# Patient Record
Sex: Male | Born: 1990 | Race: White | Hispanic: No | Marital: Married | Smoking: Never smoker
Health system: Southern US, Community
[De-identification: ages and names within clinical notes are randomized; demographics above are authoritative.]

## PROBLEM LIST (undated history)

## (undated) DIAGNOSIS — L709 Acne, unspecified: Secondary | ICD-10-CM

## (undated) HISTORY — DX: Acne, unspecified: L70.9

---
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
## 2012-07-09 ENCOUNTER — Emergency Department (HOSPITAL_COMMUNITY)
Admission: EM | Admit: 2012-07-09 | Discharge: 2012-07-09 | Disposition: A | Discharge status: Home / Self Care | Payer: BC Managed Care – PPO | Attending: Emergency Medicine | Admitting: Emergency Medicine

## 2012-07-09 ENCOUNTER — Encounter (HOSPITAL_COMMUNITY): Payer: Self-pay | Admitting: *Deleted

## 2012-07-09 DIAGNOSIS — F10929 Alcohol use, unspecified with intoxication, unspecified: Secondary | ICD-10-CM

## 2012-07-09 DIAGNOSIS — R112 Nausea with vomiting, unspecified: Secondary | ICD-10-CM | POA: Insufficient documentation

## 2012-07-09 DIAGNOSIS — F1010 Alcohol abuse, uncomplicated: Secondary | ICD-10-CM | POA: Insufficient documentation

## 2012-07-09 MED ORDER — ONDANSETRON 8 MG PO TBDP
8.0000 mg | ORAL_TABLET | Freq: Once | ORAL | Status: AC
  Administered 2012-07-09: 8 mg via ORAL
  Filled 2012-07-09: qty 1

## 2012-07-09 NOTE — ED Provider Notes (Signed)
Medical screening examination/treatment/procedure(s) were performed by non-physician practitioner and as supervising physician I was immediately available for consultation/collaboration.  Jones Skene, M.D.   Jones Skene, MD 07/09/12 [phone]

## 2012-07-09 NOTE — ED Provider Notes (Signed)
History     CSN: [id number]  Arrival date & time 07/09/12  0302   First MD Initiated Contact with Patient 07/09/12 [phone]      Chief Complaint  Patient presents with  . Alcohol Intoxication   HPI  History provided by the patient and friends. Patient is a 22 year old male with no significant PMH who presents with episodes of nausea vomiting and alcohol intoxication. Patient was out with friends and began drinking around 9 PM last evening. He reports having multiple shots of "fire ball". His last drink was 30 minutes ago prior to arrival and prior to his episodes of nausea vomiting. Patient reports also having some chills and shakes but otherwise feels well. He denies any loss of consciousness. No chest pain or abdominal pain. No shortness of breath. Symptoms are described as mild. No other aggravating or alleviating factors. No other associated symptoms.    No past medical history on file.  No past surgical history on file.  No family history on file.  History  Substance Use Topics  . Smoking status: Not on file  . Smokeless tobacco: Not on file  . Alcohol Use: Not on file      Review of Systems  Constitutional: Negative for fever.  Respiratory: Negative for shortness of breath.   Cardiovascular: Negative for chest pain.  Gastrointestinal: Positive for nausea and vomiting. Negative for abdominal pain and constipation.  Neurological: Negative for syncope.  All other systems reviewed and are negative.    Allergies  Review of patient's allergies indicates not on file.  Home Medications  No current outpatient prescriptions on file.  There were no vitals taken for this visit.  Physical Exam  Nursing note and vitals reviewed. Constitutional: He is oriented to person, place, and time. He appears well-developed and well-nourished. No distress.  HENT:  Head: Normocephalic.  Breath smells of alcohol  Cardiovascular: Normal rate and regular rhythm.   No murmur  heard. Pulmonary/Chest: Effort normal and breath sounds normal. No respiratory distress. He has no wheezes. He has no rales.  Abdominal: Soft. There is no tenderness. There is no rebound and no guarding.  Musculoskeletal: Normal range of motion.  Neurological: He is alert and oriented to person, place, and time.  Skin: Skin is warm.  Psychiatric: He has a normal mood and affect.    ED Course  Procedures       1. Alcohol intoxication   2. Nausea & vomiting       MDM  3:05 AM patient seen and evaluated. Patient having some chills otherwise appears well in no acute distress. He is awake and alert x3. Admits to heavy alcohol use with multiple episodes of vomiting. No LOC. No other complaints. Patient protecting airway normally.       Angus Seller, PA-C 07/09/12 [phone]

## 2012-07-09 NOTE — ED Notes (Signed)
Patient is alert and oriented x3.  He started drinking last night at 9pm and progressively started  To vomit when friends brought him.  He denies any pain

## 2013-02-03 ENCOUNTER — Encounter (HOSPITAL_COMMUNITY): Payer: Self-pay | Admitting: Psychiatry

## 2013-02-03 ENCOUNTER — Ambulatory Visit (INDEPENDENT_AMBULATORY_CARE_PROVIDER_SITE_OTHER): Payer: BC Managed Care – PPO | Admitting: Psychiatry

## 2013-02-03 ENCOUNTER — Encounter (INDEPENDENT_AMBULATORY_CARE_PROVIDER_SITE_OTHER): Payer: Self-pay

## 2013-02-03 VITALS — BP 133/72 | HR 98 | Wt 124.0 lb

## 2013-02-03 DIAGNOSIS — F411 Generalized anxiety disorder: Secondary | ICD-10-CM

## 2013-02-03 DIAGNOSIS — Z79899 Other long term (current) drug therapy: Secondary | ICD-10-CM

## 2013-02-03 DIAGNOSIS — F419 Anxiety disorder, unspecified: Secondary | ICD-10-CM

## 2013-02-03 MED ORDER — BUPROPION HCL ER (XL) 150 MG PO TB24: 150.0000 mg | ORAL_TABLET | ORAL | Status: DC

## 2013-02-03 NOTE — Progress Notes (Addendum)
[facility] Behavioral Health Initial Assessment Note  Don Sullivan [id number] 22 y.o.  02/03/2013 10:49 AM  Chief Complaint:  I have a lot of anxiety and nervousness.  I also has difficulty doing multitasking.  History of Present Illness:  The patient is 22 year old Caucasian employed man who is self-referred for seeking treatment for his anxiety symptoms.  Patient also endorsed difficulty doing multitasking.  Patient endorses that he saw psychiatrist at [facility] a few months ago and given Paxil.  Initially he was taking 10 mg twice a day and then dose was increased but patient decided experiencing increased side effects including anger, agitation, and hallucination and more aggression.  He also endorsed increased drinking with the Paxil.  He took for 2 months and then he stopped.  Since he stopped Paxil he has no aggression or violence and he stopped drinking.  Patient reported symptoms of lack of motivation, poor attention poor concentration and falling sleep during the job.  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  He is very behind and gets easily distracted.  At work if he is not motivated to do work, he is using Psychologist, sport and exercise her going online shopping.  Patient also endorsed limited socialization and very anxious around people.  Patient experiencing increased heart rate, sweating, vigilant and anxious in crowds.  Patient admitted these symptoms started when he was in middle school and he has trouble focusing in class.  Patient do very well in computer classes are when he is by himself.  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  Patient also reported poor self image about his body shape and acne.  He has limited social network.  Patient denies any aggression, violence, paranoia, hallucination, psychosis, poor  impulse control, suicidal thoughts, homicidal thoughts, OCD symptoms or any PTSD symptoms.  He had some anxiety and occasionally panic attack when he is around people.  However he denies any agoraphobia.  He is very concerned about his attention focus and multitasking.  He is also concerned about his social anxiety.  He is open to try any new medication.  Suicidal Ideation: No Plan Formed: No Patient has means to carry out plan: No  Homicidal Ideation: No Plan Formed: No Patient has means to carry out plan: No  Past Psychiatric History/Hospitalization(s) Patient endorses history of attention and focus since middle school.  Around same time he has a lot of anxiety and nervousness.  He always have a limited social network.  He denies any history of mania, psychosis, hallucinations, suicidal thoughts, homicidal thoughts or any poor impulse control.  He was seen by a psychiatrist at [facility] and given Paxil, initially he was given low dose however when the dose was increased he started to experience increased agitation anger and increase in alcohol use.  Patient is not happy at [facility].  Patient denies any treatment with ECT or any other psychotropic medication. Anxiety: Yes Bipolar Disorder: No Depression: No Mania: No Psychosis: No Schizophrenia: No Personality Disorder: No Hospitalization for psychiatric illness: No History of Electroconvulsive Shock Therapy: No Prior Suicide Attempts: No  Medical History; Patient has acne.  His primary care physician is Dr. Kirby Funk at [facility].  His dermatologist is Dr. Debby Freiberg .  He is taking Septra for his acne.  Patient denies any history of headaches, seizures,  traumatic brain injury or any loss of consciousness.  Traumatic brain injury:  denies  Family History;  denies  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  Psychosocial History; Patient was born and raised in  [place].  Her parents are divorced.  He is only child from his parents.  His father lives 20 minutes away and his mother lives only 2 minutes away.  Patient is living with his fiancee for he is engaged for past 2 years.  They have 46-year-old child and currently her fianc is expecting.  The patient has a good relationship with her fianc.  Legal History;  denies  History Of Abuse; Patient denies any history of physical, sexual, verbal notably emotional abuse.    Substance Abuse History;  Patient admitted history of drinking heavily when he was taking Paxil.  He admitted binge drinking but denies any tremors, shakes or any withdrawal symptoms.  Since he stopped Paxil he is no longer drinking alcohol. Patient admitted smoking marijuana and Klonopin 4 years ago however he had a bad reaction and since then he has never used marijuana or any benzodiazepine.    Review of Systems: Psychiatric: Agitation: No Hallucination: No Depressed Mood: No Insomnia: No Hypersomnia: No Altered Concentration: No Feels Worthless: No Grandiose Ideas: No Belief In Special Powers: No New/Increased Substance Abuse: No Compulsions: No  Neurologic: Headache: No Seizure: No Paresthesias: No    Outpatient Encounter Prescriptions as of 02/03/2013  Medication Sig Dispense Refill  . sulfamethoxazole-trimethoprim (BACTRIM DS) 800-160 MG per tablet       . buPROPion (WELLBUTRIN XL) 150 MG 24 hr tablet Take 1 tablet (150 mg total) by mouth every morning.  30 tablet  0  . [DISCONTINUED] cimetidine (TAGAMET) 200 MG tablet Take 400 mg by mouth 2 (two) times daily.       . [DISCONTINUED] OVER THE COUNTER MEDICATION Take 1 capsule by mouth 2 (two) times daily. OTC.Acnepril       No facility-administered encounter medications on file as of 02/03/2013.    No results found for this or any previous visit (from the past 2160 hour(s)).    Physical Exam: Constitutional:  BP 133/72  Pulse 98  Wt 124 lb (56.246  kg)  Musculoskeletal: Strength & Muscle Tone: within normal limits Gait & Station: normal Patient leans: N/A  Mental Status Examination;  Patient is a young man who is casually dressed and fairly groomed.  He appears anxious and shy.  He maintains fair eye contact.  His speech is slow, fluent and coherent.  His thought processes logical and goal-directed.  He described his mood is anxious and his affect is mood appropriate.  He denies any active or passive suicidal thoughts or homicidal thoughts.  He denies any auditory or visual hallucination.  There were no paranoia or delusions present at this time.  There were no tremors or shakes.  Psychomotor activity is slightly decreased.  His fund of knowledge is adequate.  His attention and concentration is fair.  He is alert and oriented x3.  His insight judgment and impulse control is okay.   Medical Decision Making (Choose Three): Established Problem, Stable/Improving (1), Review of Psycho-Social Stressors (1), Review or order clinical lab tests (1), Decision to obtain old records (1), Established Problem, Worsening (2), New Problem, with no additional work-up planned (3), Review of Medication Regimen & Side Effects (2) and Review of New Medication or Change in Dosage (2)  Assessment: Axis  I:  social anxiety disorder, rule out ADHD  Axis II:  deferred  Axis III:  Past Medical History  Diagnosis Date  . Acne     Axis IV:  mild to moderate   Plan:   I reviewed his history, psychosocial stressors, current medication in response to his medication.  Patient has symptoms of anxiety, nervousness and difficulty in focusing and being multitasking.  The patient has done a lot of research on the Internet about psychotropic medication.  We talked about side effects in detail.  I recommend to try Wellbutrin XL 150 mg that can help his attention, focus and also help his anxiety symptoms.  Patient is aware that any stimulant may worsen his anxiety symptoms.   I would also do CBC, CMP, hemoglobin A1c, lipid panel and TSH since patient has not done blood work in a while.  I recommend to see therapist for coping and social skills.  I recommend to call us back if he has any question concerns or having worsening of the symptoms.  I will see him again in 3 weeks.Time spent 55 minutes.  More than 50% of the time spent in psychoeducation, counseling and coordination of care.  Discuss safety plan that anytime having active suicidal thoughts or homicidal thoughts then patient need to call 911 or go to the local emergency room.    Daissy Yerian T., MD 02/03/2013

## 2013-02-16 ENCOUNTER — Other Ambulatory Visit: Payer: Self-pay

## 2013-02-17 LAB — HEMOGLOBIN A1C
Hgb A1c MFr Bld: 5.1 % (ref <5.7)
Mean Plasma Glucose: 100 mg/dL (ref <117)

## 2013-02-17 LAB — LIPID PANEL
Cholesterol: 158 mg/dL (ref 0–200)
HDL: 37 mg/dL — ABNORMAL LOW (ref >39)
LDL Cholesterol: 88 mg/dL (ref 0–99)
Total CHOL/HDL Ratio: 4.3 Ratio
Triglycerides: 166 mg/dL — ABNORMAL HIGH (ref <150)
VLDL: 33 mg/dL (ref 0–40)

## 2013-02-17 LAB — CBC WITH DIFFERENTIAL/PLATELET
Basophils Relative: 1 % (ref 0–1)
Eosinophils Absolute: 0.1 10*3/uL (ref 0.0–0.7)
HCT: 41.6 % (ref 39.0–52.0)
Hemoglobin: 14.8 g/dL (ref 13.0–17.0)
MCH: 31.4 pg (ref 26.0–34.0)
MCHC: 35.6 g/dL (ref 30.0–36.0)
Monocytes Absolute: 0.5 10*3/uL (ref 0.1–1.0)
Monocytes Relative: 13 % — ABNORMAL HIGH (ref 3–12)
Neutrophils Relative %: 43 % (ref 43–77)
Platelets: 236 10*3/uL (ref 150–400)

## 2013-02-18 LAB — TSH: TSH: 2.435 u[IU]/mL (ref 0.350–4.500)

## 2013-02-24 ENCOUNTER — Ambulatory Visit (INDEPENDENT_AMBULATORY_CARE_PROVIDER_SITE_OTHER): Payer: BC Managed Care – PPO | Admitting: Psychiatry

## 2013-02-24 ENCOUNTER — Encounter (HOSPITAL_COMMUNITY): Payer: Self-pay | Admitting: Psychiatry

## 2013-02-24 VITALS — BP 129/70 | HR 96 | Wt 123.0 lb

## 2013-02-24 DIAGNOSIS — F411 Generalized anxiety disorder: Secondary | ICD-10-CM

## 2013-02-24 DIAGNOSIS — F419 Anxiety disorder, unspecified: Secondary | ICD-10-CM

## 2013-02-24 MED ORDER — BUPROPION HCL ER (XL) 300 MG PO TB24: 300.0000 mg | ORAL_TABLET | ORAL | Status: DC

## 2013-02-24 NOTE — Progress Notes (Signed)
[facility] Behavioral Health [id number] Progress Note  Don Sullivan [id number] 22 y.o.  02/24/2013 8:51 AM  Chief Complaint:  I felt better in the first week but then started to feel the same .    History of Present Illness:  Don Sullivan came for his followup appointment.  He was seen first time on October 24 phonation evaluation.  He was started on Wellbutrin XL 150 mg daily.  Patient felt better in first week, he was able to do multiple things and feeling less depressed and less anxious however after week later his energy level go down.  He remains anxious and nervous.  He denies any side effects other than headaches which are less intense now.  He denies any tremors or shakes.  He has noticed not taking naps during the day .  He is able to do multitasking at work.  He is able to concentrate on the bigger projects.  He is not drinking or using and a little substance.  He denies any aggression or any violence which he has with Paxil.  He is living with his fiance who is expecting .  He wants to do home renovation projects and coming months.  Patient is wondering about medication dose can be further increase.  He denies any crying spells, panic attack but remains very anxious in public places.  He denies any paranoia or any hallucination.  He denies any suicidal thoughts or homicidal thoughts.  He had a blood work which shows normal results except for low HDL.  Suicidal Ideation: No Plan Formed: No Patient has means to carry out plan: No  Homicidal Ideation: No Plan Formed: No Patient has means to carry out plan: No  Past Psychiatric History/Hospitalization(s) Patient endorses history of poor attention, focus anxiety and nervousness since middle school.  He always have a limited social network.  He denies any history of mania, psychosis, hallucinations, suicidal thoughts, homicidal thoughts or any poor impulse control.  He was seen by a psychiatrist at [facility] and given Paxil, initially he was given low dose  however when the dose was increased he started to experience increased agitation anger and increase in alcohol use.  Patient is not happy at [facility].  Patient denies any treatment with ECT or any other psychotropic medication. Anxiety: Yes Bipolar Disorder: No Depression: No Mania: No Psychosis: No Schizophrenia: No Personality Disorder: No Hospitalization for psychiatric illness: No History of Electroconvulsive Shock Therapy: No Prior Suicide Attempts: No  Medical History; Patient has acne.  His primary care physician is Dr. Kirby Funk at [facility].  His dermatologist is Dr. Debby Freiberg .  He is taking Septra for his acne.  Patient denies any history of headaches, seizures, traumatic brain injury or any loss of consciousness.  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  Psychosocial History; Patient born and raised in [place].  Her parents are divorced.  He is only child from his parents.  His father lives 20 minutes away and his mother lives only 2 minutes away.  Patient is living with his fiancee for he is engaged for past 2 years.  They have 35-year-old child and currently her fianc is expecting.  He has good relationship with her fianc.  Review of Systems: Psychiatric: Agitation: No Hallucination: No Depressed Mood: No Insomnia: No Hypersomnia: No Altered Concentration: No Feels Worthless: No Grandiose Ideas: No Belief In Special Powers: No New/Increased Substance Abuse: No Compulsions: No  Neurologic: Headache: No  Seizure: No Paresthesias: No    Outpatient Encounter Prescriptions as of 02/24/2013  Medication Sig  . buPROPion (WELLBUTRIN XL) 300 MG 24 hr tablet Take 1 tablet (300 mg total) by mouth every morning.  . sulfamethoxazole-trimethoprim (BACTRIM DS) 800-160 MG per tablet   . [DISCONTINUED] buPROPion (WELLBUTRIN XL) 150 MG 24 hr tablet Take 1 tablet (150 mg total) by mouth every  morning.    Recent Results (from the past 2160 hour(s))  HEMOGLOBIN A1C     Status: None   Collection Time    02/17/13  3:57 PM      Result Value Range   Hemoglobin A1C 5.1  <5.7 %   Comment:                                                                            According to the ADA Clinical Practice Recommendations for 2011, when     HbA1c is used as a screening test:             >=6.5%   Diagnostic of Diabetes Mellitus                (if abnormal result is confirmed)           5.7-6.4%   Increased risk of developing Diabetes Mellitus           References:Diagnosis and Classification of Diabetes Mellitus,Diabetes     Care,2011,34(Suppl 1):S62-S69 and Standards of Medical Care in             Diabetes - 2011,Diabetes Care,2011,34 (Suppl 1):S11-S61.         Mean Plasma Glucose 100  <117 mg/dL  TSH     Status: None   Collection Time    02/17/13  3:57 PM      Result Value Range   TSH 2.435  0.350 - 4.500 uIU/mL  LIPID PANEL     Status: Abnormal   Collection Time    02/17/13  3:57 PM      Result Value Range   Cholesterol 158  0 - 200 mg/dL   Comment: ATP III Classification:           < 200        mg/dL        Desirable          200 - 239     mg/dL        Borderline High          >= 240        mg/dL        High         Triglycerides 166 (*) <150 mg/dL   HDL 37 (*) >78 mg/dL   Total CHOL/HDL Ratio 4.3     VLDL 33  0 - 40 mg/dL   LDL Cholesterol 88  0 - 99 mg/dL   Comment:       Total Cholesterol/HDL Ratio:CHD Risk                            Coronary Heart Disease Risk Table  Men       Women              1/2 Average Risk              3.4        3.3                  Average Risk              5.0        4.4               2X Average Risk              9.6        7.1               3X Average Risk             23.4       11.0     Use the calculated Patient Ratio above and the CHD Risk table      to determine the patient's CHD Risk.      ATP III Classification (LDL):           < 100        mg/dL         Optimal          100 - 129     mg/dL         Near or Above Optimal          130 - 159     mg/dL         Borderline High          160 - 189     mg/dL         High           > 190        mg/dL         Very High        CBC WITH DIFFERENTIAL     Status: Abnormal   Collection Time    02/17/13  3:57 PM      Result Value Range   WBC 3.9 (*) 4.0 - 10.5 K/uL   RBC 4.72  4.22 - 5.81 MIL/uL   Hemoglobin 14.8  13.0 - 17.0 g/dL   HCT 30.8  65.7 - 84.6 %   MCV 88.1  78.0 - 100.0 fL   MCH 31.4  26.0 - 34.0 pg   MCHC 35.6  30.0 - 36.0 g/dL   RDW 96.2  95.2 - 84.1 %   Platelets 236  150 - 400 K/uL   Neutrophils Relative % 43  43 - 77 %   Neutro Abs 1.7  1.7 - 7.7 K/uL   Lymphocytes Relative 41  12 - 46 %   Lymphs Abs 1.6  0.7 - 4.0 K/uL   Monocytes Relative 13 (*) 3 - 12 %   Monocytes Absolute 0.5  0.1 - 1.0 K/uL   Eosinophils Relative 2  0 - 5 %   Eosinophils Absolute 0.1  0.0 - 0.7 K/uL   Basophils Relative 1  0 - 1 %   Basophils Absolute 0.0  0.0 - 0.1 K/uL   Smear Review Criteria for review not met        Physical Exam: Constitutional:  BP 129/70  Pulse 96  Wt 123 lb (55.792 kg)  Musculoskeletal: Strength & Muscle Tone: within normal limits Gait & Station: normal  Patient leans: N/A  Mental Status Examination;  Patient is a young man who is casually dressed and fairly groomed.  He appears anxious but cooperative.  He maintains fair eye contact.  His speech is slow, fluent and coherent.  His thought processes logical and goal-directed.  He described his mood is better and his affect is improved.  He denies any active or passive suicidal thoughts or homicidal thoughts.  He denies any auditory or visual hallucination.  There were no paranoia or delusions present at this time.  There were no tremors or shakes.  Psychomotor activity is slightly decreased.  His fund of knowledge is adequate.  His attention and  concentration is fair.  He is alert and oriented x3.  His insight judgment and impulse control is okay.   Medical Decision Making (Choose Three): Established Problem, Stable/Improving (1), Review of Psycho-Social Stressors (1), Review or order clinical lab tests (1), Review of Last Therapy Session (1), Review of Medication Regimen & Side Effects (2) and Review of New Medication or Change in Dosage (2)  Assessment: Axis I:  social anxiety disorder, rule out ADHD  Axis II:  deferred  Axis III:  Past Medical History  Diagnosis Date  . Acne     Axis IV:  mild to moderate   Plan:  I review and discuss his blood work results.  Patient is tolerating Wellbutrin 150 mg and denies any side effects.  He has seen some improvement, I will increase to 300 mg.  Discuss in detail the risks and benefits of medication.  Reassurance given.  Followup in 6 weeks.  Time spent 55 minutes.  More than 50% of the time spent in psychoeducation, counseling and coordination of care.  Discuss safety plan that anytime having active suicidal thoughts or homicidal thoughts then patient need to call 911 or go to the local emergency room.    Lilliane Sposito T., MD 02/24/2013

## 2013-03-02 ENCOUNTER — Ambulatory Visit (HOSPITAL_COMMUNITY): Payer: Self-pay | Admitting: Psychiatry

## 2013-03-17 ENCOUNTER — Encounter (HOSPITAL_COMMUNITY): Payer: Self-pay | Admitting: Psychology

## 2013-03-17 ENCOUNTER — Ambulatory Visit (INDEPENDENT_AMBULATORY_CARE_PROVIDER_SITE_OTHER): Payer: BC Managed Care – PPO | Admitting: Psychology

## 2013-03-17 DIAGNOSIS — F4010 Social phobia, unspecified: Secondary | ICD-10-CM

## 2013-03-17 NOTE — Progress Notes (Signed)
Patient:   Don Sullivan   DOB:   1990-12-12  MR Number:  [id number]  Location:  [facility] OUTPATIENT THERAPY [place] [address] [id number] [place] [address] Dept: [phone]           Date of Service:   03/17/13  Start Time:   9.10am- End Time:   10:10am  Provider/Observer:  [facility]       Billing Code/Service: [phone]  Chief Complaint:     Chief Complaint  Patient presents with  . Anxiety  . Stress    relationship    Reason for Service:  Pt is referred for counseling to assist coping w/ Social Anxiety D/O and stressors from relationship.  Pt reported he has been "shy" all his life and as began researching realized that he experiences a lot of symptoms of social anxiety.  Pt reported that when in public he becomes overly self aware and self conscious- trying to figure out that right way to be and feeling inadequate.  Pt reports when having to give presentations- interviews- severe anxiety w/ shaking.  Pt reports struggles to keep conversations going in social interactions.  Pt reported that he doesn't have any friends besides fiancee's friends and family.  Pt also reports noticing some symptoms of ADHD- inattentive with difficutly follw through and motivation w/ activities that require a lot concentration or long periods of focus.  Pt also reported stress of relationship w/ fiancee as pressuring for marriage and pt doesn't feel ready to make marriage commitment as recognizes a lot of distrust from fiancee that he feels unfounded.    Current Status:  Pt reported that symptoms have improved w/ medication management.  Pt may avoid things that cause anxiety, but not avoidance if would impact job or schooling.    Reliability of Information: Pt provided information.  Behavioral Observation: Don Sullivan  presents as a 22 y.o.-year-old  Caucasian Male who appeared his stated age. his dress was Appropriate and he was Well  Groomed and his manners were Appropriate to the situation.  There were not any physical disabilities noted.  he displayed an appropriate level of cooperation and motivation.    Interactions:    Active   Attention:   within normal limits  Memory:   within normal limits  Visuo-spatial:   not examined  Speech (Volume):  normal  Speech:   normal pitch and normal volume  Thought Process:  Coherent and Relevant  Though Content:  WNL  Orientation:   person, place, time/date and situation  Judgment:   Good  Planning:   Good  Affect:    Anxious and Appropriate  Mood:    Anxious  Insight:   Good  Intelligence:   normal  Marital Status/Living: Pt lives w/ his Don Sullivan, [place], their 2y/o daughter, Don Sullivan, and they are expecting their 2nd child due April 2014.  Pt reports that he and fiancee have been together since Sophomore year of HS and have been living together since [employer] of [address].  They are currently living in home of his father's - father has moved out.  Father is still supporting some financially- but pt plans on taking over the mortgage in next couple of years.  Pt reports gets along well w/ fiancee family.  Pt had older half brother who died 2 years ago- causes unknown to pt.  Pt reports he was significant older and didn't grew up in household together.  Pt reports mom is in her mid  50s and suffering from early onset dementia. He reports that she likely has a problem w/ drinking.    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  Past Employment:  n/a  Substance Use:  No concerns of substance abuse are reported.  Pt reported he stopped drinking alcohol over a month ago as noticed when on paxil and drank- was becoming agitated.   Education:   College  Medical History:   Past Medical History  Diagnosis Date  . Acne         Outpatient Encounter Prescriptions as of 03/17/2013  Medication Sig  . buPROPion (WELLBUTRIN XL) 300 MG 24 hr tablet Take  1 tablet (300 mg total) by mouth every morning.  . sulfamethoxazole-trimethoprim (BACTRIM DS) 800-160 MG per tablet           Sexual History:   History  Sexual Activity  . Sexual Activity: Yes    Abuse/Trauma History: None reported  Psychiatric History:  No counseling previous.  Pt began tx w/ Monrach- but dissatisfied.  Started medication management w/ Dr. Lolly Mustache about 1 month ago.   Family Med/Psych History: History reviewed. No pertinent family history.  Risk of Suicide/Violence: virtually non-existent no hx of SI/HI or self harm or aggression.  Impression/DX:  Pt is a 22y/o male who presents seeking counseling to assist with social anxiety and stressors.  Pt endorses hx of social anxiety beginning in middle school but just recently seeking tx.  Pt discusses stressor of relationship with fiance and pressure to marry.  Pt good insight, motivated in tx and articulate. No reports of depressive episodes, SA or SI.   Disposition/Plan:  F/u in 1-2 weeks.  See tx plan.  Diagnosis:     Social anxiety disorder

## 2013-03-31 ENCOUNTER — Ambulatory Visit (INDEPENDENT_AMBULATORY_CARE_PROVIDER_SITE_OTHER): Payer: BC Managed Care – PPO | Admitting: Psychology

## 2013-03-31 DIAGNOSIS — F4010 Social phobia, unspecified: Secondary | ICD-10-CM

## 2013-03-31 NOTE — Progress Notes (Signed)
   THERAPIST PROGRESS NOTE  Session Time: 8.08am-9:15am  Participation Level: Active  Behavioral Response: Well GroomedAlertAnxious  Type of Therapy: Individual Therapy  Treatment Goals addressed: Diagnosis: Social Anxiety D/O and goal 1.  Interventions: CBT and Other: [employer]- heart focus and heart breathing techniqus  Summary: Don Sullivan is a 22 y.o. male who presents with slight anxious affect. Pt discloses well in session.  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  Pt reports at work seems to have maintained focus, but not at home. Pt reports stressor of relationship w/ fiancee.  Pt discloses that he has come to realize not wanting to continue in his relationship w/ his fiancee, but feels trapped as cares about her, doesn't want to hurt her, doesn't want to leave kids.  Pt was able to practice heart math techniques in session, expressed feeling more calm and "alive" with practicing.  Pt agrees to implement at home.  Pt reports he is will also give thought to couples counseling.   Suicidal/Homicidal: Nowithout intent/plan  Therapist Response: Assessed pt current functioning per pt report.  Processed w/ pt stressors and how impacting emotion.  Introduced to heart math techniques and led pt in session w/ practice, processed response and discussed how to implement at home.   Plan: Return again in 3-4 weeks per pt request due to financial reasons. .  Diagnosis: Axis I: Social Anxiety    Axis II: No diagnosis    [place]iana Searles, LPC 03/31/2013

## 2013-04-14 ENCOUNTER — Ambulatory Visit (HOSPITAL_COMMUNITY): Payer: Self-pay | Admitting: Psychiatry

## 2013-04-21 ENCOUNTER — Encounter (HOSPITAL_COMMUNITY): Payer: Self-pay | Admitting: Psychiatry

## 2013-04-21 ENCOUNTER — Ambulatory Visit (INDEPENDENT_AMBULATORY_CARE_PROVIDER_SITE_OTHER): Payer: Private Health Insurance - Indemnity | Admitting: Psychiatry

## 2013-04-21 VITALS — BP 116/78 | HR 108 | Wt 119.0 lb

## 2013-04-21 DIAGNOSIS — F419 Anxiety disorder, unspecified: Secondary | ICD-10-CM

## 2013-04-21 DIAGNOSIS — F411 Generalized anxiety disorder: Secondary | ICD-10-CM

## 2013-04-21 MED ORDER — BUPROPION HCL ER (XL) 450 MG PO TB24: 450.0000 mg | ORAL_TABLET | ORAL | Status: DC

## 2013-04-21 NOTE — Progress Notes (Signed)
[facility] Progress Note  Don Sullivan [id number] 23 y.o.  04/21/2013 8:54 AM  Chief Complaint:  I still have a lot of anxiety and distress.    History of Present Illness:  Don Sullivan came for his followup appointment.  On his last visit we increased his Wellbutrin to 300 mg.  He is taking his medication and seeing improvement in his focus and attention but he also feels that his anxiety is not getting better.  He continues to have nervousness especially when he goes in public.  His fiance is expecting in April and patient is very happy.  Patient decided to get married in September 2015.  He was very busy in the Christmas.  He visited multiple family members however he was very anxious around strangers.  He also noticed increased stress at work which he believes because he is able to do more for him feeling more sense of responsibility .  Patient does not have any side effects of medication however admitted decreased appetite and he has lost weight from the last visit.  Patient told this is his baseline but admitted some time he skipped a meal because he has no appetite.  He has no tremors or shakes.  He is not drinking or using any illegal substances.  He is not taking naps during the day however he still feels sometimes very tired and lack of motivation to do things.  He has not started the renovation projects which he wants to do and coming months.  He is wondering if he has ADD and he likes to try stimulants.  He denies any crying spells, panic attack, aggression, violence or any feeling of hopelessness or helplessness.  He is seeing therapist this office her coping skills.  Suicidal Ideation: No Plan Formed: No Patient has means to carry out plan: No  Homicidal Ideation: No Plan Formed: No Patient has means to carry out plan: No  Past Psychiatric History/Hospitalization(s) Patient endorses history of poor attention, focus anxiety and nervousness since middle school.  He always  had a limited social network.  He denies any history of mania, psychosis, hallucinations, suicidal thoughts, homicidal thoughts or any poor impulse control.  He was seen by a psychiatrist at [facility] and given Paxil, initially he was given low dose however when the dose was increased he started to experience increased agitation anger and increase in alcohol use.  Patient endorses history of social anxiety and nervousness. Anxiety: Yes Bipolar Disorder: No Depression: No Mania: No Psychosis: No Schizophrenia: No Personality Disorder: No Hospitalization for psychiatric illness: No History of Electroconvulsive Shock Therapy: No Prior Suicide Attempts: No  Medical History; Patient has acne.  His primary care physician is Dr. Lavone Orn at [facility].  His dermatologist is Dr. Roxy Cedar .  He is taking Septra for his acne.  Patient denies any history of headaches, seizures, traumatic brain injury or any loss of consciousness.  Education and Work History; Patient has Public relations account executive.  He is working at [employer] for past 1-1/2 years.  Psychosocial History; Patient born and raised in [place].  Her parents are divorced.  He is only child from his parents.  His father lives 20 minutes away and his mother lives only 2 minutes away.  Patient is living with his fiancee for he is engaged for past 2 years.  They have 66-year-old child and currently her fianc is expecting.  He has good relationship with her fianc.  Review of Systems: Psychiatric: Agitation: No Hallucination: No Depressed  Mood: No Insomnia: No Hypersomnia: No Altered Concentration: No Feels Worthless: No Grandiose Ideas: No Belief In Special Powers: No New/Increased Substance Abuse: No Compulsions: No  Neurologic: Headache: No Seizure: No Paresthesias: No    Outpatient Encounter Prescriptions as of 04/21/2013  Medication Sig  . buPROPion 450 MG TB24 Take 450 mg by mouth every morning.  .  sulfamethoxazole-trimethoprim (BACTRIM DS) 800-160 MG per tablet   . [DISCONTINUED] buPROPion (WELLBUTRIN XL) 300 MG 24 hr tablet Take 1 tablet (300 mg total) by mouth every morning.    Recent Results (from the past 2160 hour(s))  HEMOGLOBIN A1C     Status: None   Collection Time    02/17/13  3:57 PM      Result Value Range   Hemoglobin A1C 5.1  <5.7 %   Comment:                                                                            According to the ADA Clinical Practice Recommendations for 2011, when     HbA1c is used as a screening test:             >=6.5%   Diagnostic of Diabetes Mellitus                (if abnormal result is confirmed)           5.7-6.4%   Increased risk of developing Diabetes Mellitus           References:Diagnosis and Classification of Diabetes Mellitus,Diabetes     VELF,8101,75(ZWCHE 1):S62-S69 and Standards of Medical Care in             Diabetes - 2011,Diabetes Care,2011,34 (Suppl 1):S11-S61.         Mean Plasma Glucose 100  <117 mg/dL  TSH     Status: None   Collection Time    02/17/13  3:57 PM      Result Value Range   TSH 2.435  0.350 - 4.500 uIU/mL  LIPID PANEL     Status: Abnormal   Collection Time    02/17/13  3:57 PM      Result Value Range   Cholesterol 158  0 - 200 mg/dL   Comment: ATP III Classification:           < 200        mg/dL        Desirable          200 - 239     mg/dL        Borderline High          >= 240        mg/dL        High         Triglycerides 166 (*) <150 mg/dL   HDL 37 (*) >39 mg/dL   Total CHOL/HDL Ratio 4.3     VLDL 33  0 - 40 mg/dL   LDL Cholesterol 88  0 - 99 mg/dL   Comment:       Total Cholesterol/HDL Ratio:CHD Risk  Coronary Heart Disease Risk Table                                            Men       Women              1/2 Average Risk              3.4        3.3                  Average Risk              5.0        4.4               2X Average Risk              9.6         7.1               3X Average Risk             23.4       11.0     Use the calculated Patient Ratio above and the CHD Risk table      to determine the patient's CHD Risk.     ATP III Classification (LDL):           < 100        mg/dL         Optimal          100 - 129     mg/dL         Near or Above Optimal          130 - 159     mg/dL         Borderline High          160 - 189     mg/dL         High           > 190        mg/dL         Very High        CBC WITH DIFFERENTIAL     Status: Abnormal   Collection Time    02/17/13  3:57 PM      Result Value Range   WBC 3.9 (*) 4.0 - 10.5 K/uL   RBC 4.72  4.22 - 5.81 MIL/uL   Hemoglobin 14.8  13.0 - 17.0 g/dL   HCT 41.6  39.0 - 52.0 %   MCV 88.1  78.0 - 100.0 fL   MCH 31.4  26.0 - 34.0 pg   MCHC 35.6  30.0 - 36.0 g/dL   RDW 13.1  11.5 - 15.5 %   Platelets 236  150 - 400 K/uL   Neutrophils Relative % 43  43 - 77 %   Neutro Abs 1.7  1.7 - 7.7 K/uL   Lymphocytes Relative 41  12 - 46 %   Lymphs Abs 1.6  0.7 - 4.0 K/uL   Monocytes Relative 13 (*) 3 - 12 %   Monocytes Absolute 0.5  0.1 - 1.0 K/uL   Eosinophils Relative 2  0 - 5 %   Eosinophils Absolute 0.1  0.0 - 0.7 K/uL   Basophils Relative 1  0 - 1 %   Basophils Absolute 0.0  0.0 - 0.1 K/uL   Smear Review Criteria for review not met        Physical Exam: Constitutional:  BP 116/78  Pulse 108  Wt 119 lb (53.978 kg)  Musculoskeletal: Strength & Muscle Tone: within normal limits Gait & Station: normal Patient leans: N/A  Mental Status Examination;  Patient is a young man who is casually dressed and fairly groomed.  He appears anxious but cooperative.  He maintains fair eye contact.  His speech is slow, fluent and coherent.  His thought processes logical and goal-directed.  He described his mood is neutral and his affect is mood appropriate.  He denies any active or passive suicidal thoughts or homicidal thoughts.  He denies any auditory or visual hallucination.  There were no  paranoia or delusions present at this time.  There were no tremors or shakes.  Psychomotor activity is slightly decreased.  His fund of knowledge is adequate.  His attention and concentration is fair.  He is alert and oriented x3.  His insight judgment and impulse control is okay.   Medical Decision Making (Choose Three): Established Problem, Stable/Improving (1), Review of Psycho-Social Stressors (1), Review of Last Therapy Session (1), Review of Medication Regimen & Side Effects (2) and Review of New Medication or Change in Dosage (2)  Assessment: Axis I:  social anxiety disorder, rule out ADHD  Axis II:  deferred  Axis III:  Past Medical History  Diagnosis Date  . Acne     Axis IV:  mild to moderate   Plan:  I recommend to increase Wellbutrin to 450 mg .  I also discussed about his appetite and weight loss.  Encouraged to take meals on time.  Patient also wondering if he can take stimulant however I explained that taking a stimulant may cause worsening of anxiety and nervousness.  We will defer adding stimulant at this time however if his attention and focus does not improve to consider low-dose stimulant in the future.  Patient does not have any tremors or shakes.  Recommend to see therapist for coping and social skills.  Followup in 2 months.  Time spent 25 minutes.  More than 50% of the time spent in psychoeducation, counseling and coordination of care.  Discuss safety plan that anytime having active suicidal thoughts or homicidal thoughts then patient need to call 911 or go to the local emergency room.    Prisma Decarlo T., MD 04/21/2013

## 2013-05-01 ENCOUNTER — Telehealth (HOSPITAL_COMMUNITY): Payer: Self-pay | Admitting: Psychiatry

## 2013-05-01 ENCOUNTER — Telehealth (HOSPITAL_COMMUNITY): Payer: Self-pay | Admitting: *Deleted

## 2013-05-01 MED ORDER — BUPROPION HCL ER (XL) 150 MG PO TB24: ORAL_TABLET | ORAL | Status: DC

## 2013-05-01 NOTE — Telephone Encounter (Signed)
Ordered changed to Wellbutrin XL 150mg  3 tablets every morning per Dr. Lolly MustacheArfeen. Pt unable to afford brand name and requested Generic form however 450mg  does not come in generic form. Called to notify patient of prescription change no answer at this time. Will try again later today.

## 2013-05-01 NOTE — Telephone Encounter (Signed)
Pt called stating the dosage was changed on his medication and this also changed the brand at his pharmacy. The new dosage change has caused and increase in the cost and he is unable to afford it currently. He would like to know what to do.

## 2013-05-01 NOTE — Telephone Encounter (Signed)
Will change into generic Wellbutrin.

## 2013-05-05 ENCOUNTER — Ambulatory Visit (INDEPENDENT_AMBULATORY_CARE_PROVIDER_SITE_OTHER): Payer: Private Health Insurance - Indemnity | Admitting: Psychology

## 2013-05-05 DIAGNOSIS — F4010 Social phobia, unspecified: Secondary | ICD-10-CM

## 2013-05-05 NOTE — Progress Notes (Signed)
   THERAPIST PROGRESS NOTE  Session Time: 10:11am-11am  Participation Level: Active  Behavioral Response: Well GroomedAlertEuthymic  Type of Therapy: Individual Therapy  Treatment Goals addressed: Diagnosis: Social Anxiety and goal 1.  Interventions: CBT and Supportive  Summary: Don MulletDylan Sullivan is a 23 y.o. male who presents with full and bright affect.   Pt reported on a lot of recent happenings in his life.  He and his fiancee have set a date for the wedding- 12/30/13, pt has a raise from work, pt got a loan for wedding and paying down debt and so not feeling stressors of fiances.  Pt expressed feeling in better place w/ relationship and not letting anxiety govern decision.  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  Pt did report that he still avoids doing larger jobs- will procrastinate.  Pt does feel improvement w/ anxiety, improvement w/ energy, but still feels stimulant medication may be helpful for him.  Pt reports only trying heartmath practice 2 times- was beneficial but struggling to find time to practice.    Suicidal/Homicidal: Nowithout intent/plan  Therapist Response: Assessed pt current functioning per pt report.  Processed w/ pt change in decision for committing to marriage and setting date.  Explored w/pt factors that have reduced anxiety and stress.  Explored w/pt use of heartmath technique and challenged pt to make commitment to self care practices.   Plan: Return again in 2-4 weeks.  Pt requests 4 weeks..  Diagnosis: Axis I: Social Anxiety    Axis II: No diagnosis    YATES,LEANNE, LPC 05/05/2013

## 2013-05-29 ENCOUNTER — Telehealth (HOSPITAL_COMMUNITY): Payer: Self-pay | Admitting: *Deleted

## 2013-05-29 NOTE — Telephone Encounter (Signed)
Notified pt that 05/01/13 RX changed by MD to Wellbutrin XL 150 mg/3 a day. Advised pt to contact pharmacy

## 2013-05-29 NOTE — Telephone Encounter (Signed)
Pt left VM 2/13 @ 0926: VM recv'd 2/19 @ 0926: Was prescribed Forfivo.More expensive.[facility] had to order it.Won't have it until Monday.Only has has one pill left.Willing to change to more common medication - easier and cheaper

## 2013-06-02 ENCOUNTER — Ambulatory Visit (HOSPITAL_COMMUNITY): Payer: Self-pay | Admitting: Psychology

## 2013-06-23 ENCOUNTER — Encounter (HOSPITAL_COMMUNITY): Payer: Self-pay | Admitting: Psychiatry

## 2013-06-23 ENCOUNTER — Ambulatory Visit (INDEPENDENT_AMBULATORY_CARE_PROVIDER_SITE_OTHER): Payer: Private Health Insurance - Indemnity | Admitting: Psychiatry

## 2013-06-23 VITALS — BP 120/56 | HR 92 | Ht 68.0 in | Wt 119.0 lb

## 2013-06-23 DIAGNOSIS — F411 Generalized anxiety disorder: Secondary | ICD-10-CM

## 2013-06-23 DIAGNOSIS — F988 Other specified behavioral and emotional disorders with onset usually occurring in childhood and adolescence: Secondary | ICD-10-CM

## 2013-06-23 MED ORDER — METHYLPHENIDATE HCL 10 MG PO TABS: 10.0000 mg | ORAL_TABLET | Freq: Every day | ORAL | Status: DC

## 2013-06-23 NOTE — Progress Notes (Signed)
[facility] [id number] Progress Note  Don Sullivan [id number] 22 y.o.  06/23/2013 9:09 AM  Chief Complaint:  I don't think increase Wellbutrin is working for my attention and focus.  It is helping some anxiety but I have difficulty when multitasking.  I'm drinking more coffee to stay at work.    History of Present Illness:  Don Sullivan came for his followup appointment.  On his last visit we increased his Wellbutrin to 450 mg.  He does not see any improvement in his attention and focus.  He does feel less anxious and able to socialize but he has great difficulty at work.  He admitted drinking more coffee and energy drink at work.  Patient is concerned because of the new project he had which requires a lot of attention and concentration.  Patient admitted difficulty focusing at work.  Patient is tolerating Wellbutrin 450 mg daily.  He denies any tremors, shakes or any insomnia.  He denies any chest pain or any panic attack.  Patient does not drink alcohol or use any illegal substances.  He denies any anger or any severe mood swing but admitted some time very anxious around people and does not like going outside unless it is necessary.  His fiance is expecting in April.  Patient has decided to get married in September 2015.  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  Patient denies any hallucination, paranoia, aggression or any violence.  He denies any feeling of hopelessness or worthlessness.  He is seeing therapist this office her coping skills.  Suicidal Ideation: No Plan Formed: No Patient has means to carry out plan: No  Homicidal Ideation: No Plan Formed: No Patient has means to carry out plan: No  Past Psychiatric History/Hospitalization(s) Patient endorses history of poor attention, focus anxiety and nervousness since middle school.  He always had a limited social network.  He denies any history of mania, psychosis, hallucinations, suicidal  thoughts, homicidal thoughts or any poor impulse control.  He was seen by a psychiatrist at [facility] and given Paxil, initially he was given low dose however when the dose was increased he started to experience increased agitation anger and increase in alcohol use.  Patient endorses history of social anxiety and nervousness. Anxiety: Yes Bipolar Disorder: No Depression: No Mania: No Psychosis: No Schizophrenia: No Personality Disorder: No Hospitalization for psychiatric illness: No History of Electroconvulsive Shock Therapy: No Prior Suicide Attempts: No  Medical History; Patient has acne.  His primary care physician is Dr. Kirby Funk at [facility].  His dermatologist is Dr. Debby Freiberg .  He is taking Septra for his acne.  Patient denies any history of headaches, seizures, traumatic brain injury or any loss of consciousness.  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   Psychosocial History; Patient born and raised in [place].  Her parents are divorced.  He is only child from his parents.  His father lives 20 minutes away and his mother lives only 2 minutes away.  Patient is living with his fiancee for he is engaged for past 2 years.  They have 66-year-old child and currently her fianc is expecting.  He has good relationship with her fianc.  Review of Systems: Psychiatric: Agitation: No Hallucination: No Depressed Mood: No Insomnia: No Hypersomnia: No Altered Concentration: Difficulty concentration and multitasking Feels Worthless: No Grandiose Ideas: No Belief In Special Powers: No New/Increased Substance Abuse: No Compulsions: No  Neurologic: Headache: No Seizure:  No Paresthesias: No    Outpatient Encounter Prescriptions as of 06/23/2013  Medication Sig  . buPROPion (WELLBUTRIN XL) 150 MG 24 hr tablet Take 3 tablets in the morning  . sulfamethoxazole-trimethoprim (BACTRIM DS) 800-160 MG per tablet   . methylphenidate  (RITALIN) 10 MG tablet Take 1 tablet (10 mg total) by mouth daily.    No results found for this or any previous visit (from the past 2160 hour(s)).    Physical Exam: Constitutional:  BP 120/56  Pulse 92  Ht 5\' 8"  (1.727 m)  Wt 119 lb (53.978 kg)  BMI 18.10 kg/m2  Musculoskeletal: Strength & Muscle Tone: within normal limits Gait & Station: normal Patient leans: N/A  Mental Status Examination;  Patient is a young man who is casually dressed and fairly groomed.  He appears anxious but cooperative.  He maintains fair eye contact.  His speech is slow, fluent and coherent.  His thought processes logical and goal-directed.  He described his mood is neutral and his affect is mood appropriate.  He denies any active or passive suicidal thoughts or homicidal thoughts.  He denies any auditory or visual hallucination.  His attention and concentration is sometimes distracted .  There were no paranoia or delusions present at this time.  There were no tremors or shakes.  Psychomotor activity is slightly decreased.  His fund of knowledge is adequate.  He is alert and oriented x3.  His insight judgment and impulse control is okay.   Established Problem, Stable/Improving (1), New problem, with additional work up planned, Review of Psycho-Social Stressors (1), Established Problem, Worsening (2), Review of Last Therapy Session (1), Review of Medication Regimen & Side Effects (2) and Review of New Medication or Change in Dosage (2)  Assessment: Axis I:  social anxiety disorder, ADD   Axis II:  deferred  Axis III:  Past Medical History  Diagnosis Date  . Acne     Axis IV:  mild to moderate   Plan:  Despite taking Wellbutrin for 50 mg patient does not see any improvement in her attention and focus.  He continues to have difficulty doing multitasking.  We will try low dose stimulant and cut down his Wellbutrin to 150 mg only.  We will try Ritalin 10 mg daily.  Patient wants generic medication.  I  explained that sometimes to him and can cause worsening of anxiety and panic attack along with insomnia and chest pain.  In that case he needs to inform us immediately.  Patient understands.  I also discuss the abuse of stimulants, withdrawal and dependency.  Recommended to continue Wellbutrin 150 mg only along with Ritalin 10 mg daily.  Continue to see therapist for coping and social skills.  Followup in 4 weeks.  However if symptoms get worse or if he has any questions and he should call us immediately.  If patient is able to tolerate the stimulants we will consider the dose on his next appointment. Time spent 25 minutes.  More than 50% of the time spent in psychoeducation, counseling and coordination of care.  Discuss safety plan that anytime having active suicidal thoughts or homicidal thoughts then patient need to call 911 or go to the local emergency room.    Chaise Passarella T., MD 06/23/2013

## 2013-07-09 ENCOUNTER — Other Ambulatory Visit (HOSPITAL_COMMUNITY): Payer: Self-pay | Admitting: Psychiatry

## 2013-07-09 DIAGNOSIS — F988 Other specified behavioral and emotional disorders with onset usually occurring in childhood and adolescence: Secondary | ICD-10-CM

## 2013-07-11 ENCOUNTER — Telehealth (HOSPITAL_COMMUNITY): Payer: Self-pay | Admitting: Psychiatry

## 2013-07-11 ENCOUNTER — Ambulatory Visit (HOSPITAL_COMMUNITY): Payer: Self-pay | Admitting: Psychiatry

## 2013-07-11 NOTE — Telephone Encounter (Signed)
RX sent to pharmacy.Reviewed dose with patient - he states he knows he is to take only 150 mg of Wellbutrin.

## 2013-07-13 ENCOUNTER — Telehealth (HOSPITAL_COMMUNITY): Payer: Self-pay | Admitting: *Deleted

## 2013-07-13 NOTE — Telephone Encounter (Signed)
Patient left FA:[id number]:Insurance changed to mail order.Able to pick up at [employer],but will cost more.Please call.Does not want to stop taking Wellbutrin. Contacted patient: Needs letter sent to [facility] to ask for exemption as MD still titrating dose and 90 day supply not appropriate. Patient states he talked to insurance company.MD office to contact them and ask for exception to policy.

## 2013-07-24 ENCOUNTER — Other Ambulatory Visit (HOSPITAL_COMMUNITY): Payer: Self-pay | Admitting: Psychiatry

## 2013-07-24 ENCOUNTER — Telehealth: Payer: Self-pay | Admitting: Internal Medicine

## 2013-07-24 NOTE — Telephone Encounter (Signed)
I received a call on the on-call pager by Mr. Don Sullivan who is a patient of Dr. Valentina LucksGriffin and Dr. Lolly MustacheArfeen in regards to him needing medication refills. Mr. Don Sullivan is not one of our OPC patients and therefore he was directed to call his PCP or Dr. Willodean RosenthalAfreen's office for further instructions on medication refill. He claims he called the office last week but received no phone call. He would like his Ritalin refilled.   -I will try to forward this note to the PCP and Dr. Lolly MustacheArfeen and patient was also provided with office contact number for Dr. Lolly MustacheArfeen to try to call.

## 2013-07-25 ENCOUNTER — Other Ambulatory Visit (HOSPITAL_COMMUNITY): Payer: Self-pay | Admitting: Psychiatry

## 2013-07-25 ENCOUNTER — Other Ambulatory Visit (HOSPITAL_COMMUNITY): Payer: Self-pay | Admitting: *Deleted

## 2013-07-25 DIAGNOSIS — F988 Other specified behavioral and emotional disorders with onset usually occurring in childhood and adolescence: Secondary | ICD-10-CM

## 2013-07-25 MED ORDER — METHYLPHENIDATE HCL 10 MG PO TABS: 10.0000 mg | ORAL_TABLET | Freq: Every day | ORAL | Status: DC

## 2013-08-04 ENCOUNTER — Encounter (HOSPITAL_COMMUNITY): Payer: Self-pay | Admitting: Psychiatry

## 2013-08-04 ENCOUNTER — Ambulatory Visit (INDEPENDENT_AMBULATORY_CARE_PROVIDER_SITE_OTHER): Payer: Private Health Insurance - Indemnity | Admitting: Psychiatry

## 2013-08-04 VITALS — BP 126/73 | HR 98 | Ht 67.0 in | Wt 117.6 lb

## 2013-08-04 DIAGNOSIS — F988 Other specified behavioral and emotional disorders with onset usually occurring in childhood and adolescence: Secondary | ICD-10-CM

## 2013-08-04 DIAGNOSIS — F411 Generalized anxiety disorder: Secondary | ICD-10-CM

## 2013-08-04 MED ORDER — METHYLPHENIDATE HCL 10 MG PO TABS: 10.0000 mg | ORAL_TABLET | Freq: Every day | ORAL | Status: DC

## 2013-08-04 MED ORDER — BUPROPION HCL ER (XL) 150 MG PO TB24: ORAL_TABLET | ORAL | Status: DC

## 2013-08-04 NOTE — Progress Notes (Signed)
[facility] [id number] Progress Note  Jalene MulletDylan Dunkleberger [id number] 22 y.o.  08/04/2013 9:45 AM  Chief Complaint:  I like the medication.  My anxiety is much better.  My focus and attention is improved.      History of Present Illness:  Don Sullivan came for his followup appointment.  On his last visit we started Ritalin 10 mg and continue Wellbutrin XL 150 mg daily.  Patient seemed much improvement in his anxiety , focused attention and multitasking.  He denies any side effects however I have noticed that he is losing weight which could be medication side effects.  Patient does not want to change his medication.  Sometime he feels that medicine is losing the effect in the afternoon and burning of the dose can be further increase.  Patient do not report any changes in his appetite .  He is sleeping better.  He denies any agitation anger or any mood swing.  He has no tremors or shakes.  Is not drinking or using any illegal substances.  He denies any panic attack in recent weeks.  He is excited and anxious because his fiance is expecting and due next week. Patient has decided to get married in September 2015.  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  Patient denies any hallucination, paranoia, aggression or any violence.  He recently seen his dermatologist and taking antibiotic for acne.  Suicidal Ideation: No Plan Formed: No Patient has means to carry out plan: No  Homicidal Ideation: No Plan Formed: No Patient has means to carry out plan: No  Past Psychiatric History/Hospitalization(s) Patient endorses history of poor attention, focus anxiety and nervousness since middle school.  He always had a limited social network.  He denies any history of mania, psychosis, hallucinations, suicidal thoughts, homicidal thoughts or any poor impulse control.  He was [facility] by a [facility] at [facility] and given Paxil, initially he was given low dose however when the dose was  increased he started to experience increased agitation anger and increase in alcohol use.  Patient endorses history of social anxiety and nervousness. Anxiety: Yes Bipolar Disorder: No Depression: No Mania: No Psychosis: No Schizophrenia: No Personality Disorder: No Hospitalization for psychiatric illness: No History of Electroconvulsive Shock Therapy: No Prior Suicide Attempts: No  Medical History; Patient has acne.  His primary care physician is Dr. Kirby FunkJohn Griffin at [facility].  His dermatologist is Dr. Debby Freibergrew John .    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   Psychosocial History; Patient born and raised in [place].  Her parents are divorced.  He is only child from his parents.  His father lives 20 minutes away and his mother lives only 2 minutes away.  Patient is living with his fiancee for he is engaged for past 2 years.  They have 23-year-old child and currently her fianc is expecting.  He has good relationship with her fianc.  Review of Systems: Psychiatric: Agitation: No Hallucination: No Depressed Mood: No Insomnia: No Hypersomnia: No Altered Concentration: No Feels Worthless: No Grandiose Ideas: No Belief In Special Powers: No New/Increased Substance Abuse: No Compulsions: No  Neurologic: Headache: No Seizure: No Paresthesias: No    Outpatient Encounter Prescriptions as of 08/04/2013  Medication Sig  . buPROPion (WELLBUTRIN XL) 150 MG 24 hr tablet TAKE ONE TABLET BY MOUTH EVERY MORNING  . cephALEXin (KEFLEX) 500 MG capsule   . methylphenidate (RITALIN) 10 MG tablet Take 1 tablet (10  mg total) by mouth daily.  Marland Kitchen. sulfamethoxazole-trimethoprim (BACTRIM DS) 800-160 MG per tablet   . [DISCONTINUED] buPROPion (WELLBUTRIN XL) 150 MG 24 hr tablet TAKE ONE TABLET BY MOUTH EVERY MORNING   . [DISCONTINUED] methylphenidate (RITALIN) 10 MG tablet Take 1 tablet (10 mg total) by mouth daily.  . [DISCONTINUED]  methylphenidate (RITALIN) 10 MG tablet Take 1 tablet (10 mg total) by mouth daily.    No results found for this or any previous visit (from the past 2160 hour(s)).    Physical Exam: Constitutional:  BP 126/73  Pulse 98  Ht 5\' 7"  (1.702 m)  Wt 117 lb 9.6 oz (53.343 kg)  BMI 18.41 kg/m2  Musculoskeletal: Strength & Muscle Tone: within normal limits Gait & Station: normal Patient leans: N/A  Mental Status Examination;  Patient is a young man who is casually dressed and fairly groomed.  He is cooperative.  He maintains fair eye contact.  His speech is slow, fluent and coherent.  His thought processes logical and goal-directed.  He described his mood is neutral and his affect is mood appropriate.  He denies any active or passive suicidal thoughts or homicidal thoughts.  He denies any auditory or visual hallucination.  His attention and concentration is ok.  There were no paranoia or delusions present at this time.  There were no tremors or shakes.  Psychomotor activity is slightly decreased.  His fund of knowledge is adequate.  He is alert and oriented x3.  His insight judgment and impulse control is okay.   Established Problem, Stable/Improving (1), Review of Last Therapy Session (1) and Review of Medication Regimen & Side Effects (2)  Assessment: Axis I:  social anxiety disorder, ADD   Axis II:  deferred  Axis III:  Past Medical History  Diagnosis Date  . Acne     Axis IV:  mild to moderate   Plan:  Patient is doing much better on his current psychotropic medication.  However he is losing weight and we will defer any increase  Dosage o and Wellbutrin.  Patient agreed .  I recommended to keep watching his calorie intake and appetite.  If he continued to lose weight and he should see his primary care physician .  I will continue Wellbutrin XL 150 mg daily and Ritalin 10 mg daily.  I also discuss the abuse of stimulants, withdrawal and dependency.  Recommended to call us back if he  has any question or any concern.  I will see him again in 2 months.   Alyscia Carmon T., MD 08/04/2013

## 2013-08-21 ENCOUNTER — Other Ambulatory Visit (HOSPITAL_COMMUNITY): Payer: Self-pay | Admitting: Psychiatry

## 2013-08-21 ENCOUNTER — Telehealth (HOSPITAL_COMMUNITY): Payer: Self-pay | Admitting: *Deleted

## 2013-08-21 NOTE — Telephone Encounter (Signed)
Patient left WU:[id number]:Still having problems with medication r/t Pharmacy [facility]/Home Delivery.Still  high price at local pharmacy-too much.Wondered if it was okay for him to stop when this prescription ends as he cannot afford it unless it is mail order.  Contacted [facility]:Patient plan allows only 2 prescription fills at local pharmacy. Patient has used those 2 fills. After that, pt must have prescription filled for 90 days thru Mail Order. Wellbutrin XL 150 mg daily #90 will cost him only $20. If medication is still being titrated, patient wants to fill at local pharmacy, not mail order. If dose is stabilized, could fill though mail order.Conversation ended 1137.  Contacted pt: Advised patient not to stop medication.Patient states he will accept Mail Order if okay with MD.Currently has 10-15 pills, so would allow time for delivery if authorized by MD.

## 2013-08-23 ENCOUNTER — Other Ambulatory Visit (HOSPITAL_COMMUNITY): Payer: Self-pay | Admitting: *Deleted

## 2013-08-23 DIAGNOSIS — F988 Other specified behavioral and emotional disorders with onset usually occurring in childhood and adolescence: Secondary | ICD-10-CM

## 2013-08-23 MED ORDER — BUPROPION HCL ER (XL) 150 MG PO TB24: ORAL_TABLET | ORAL | Status: DC

## 2013-08-23 NOTE — Telephone Encounter (Signed)
Previous Call Documentation in BOLD Tonny BollmanSandra M Sabreen Kitchen, RN at 08/21/2013 11:27 AM   Status: Signed Patient left EA:[id number]:Still having problems with medication r/t Pharmacy [facility]/Home Delivery.Still  high price at local pharmacy-too much.Wondered if it was okay for him to stop when this prescription ends as he cannot afford it unless it is mail order. Contacted [facility]:Patient plan allows only 2 prescription fills at local pharmacy. Patient has used those 2 fills. After that, pt must have prescription filled for 90 days thru Mail Order. Wellbutrin XL 150 mg daily #90 will cost him only $20. If medication is still being titrated, patient wants to fill at local pharmacy, not mail order. If dose is stabilized, could fill though mail order.Conversation ended 1137. Contacted pt: Advised patient not to stop medication.Patient states he will accept Mail Order if okay with MD.Currently has 10-15 pills, so would allow time for delivery if authorized by MD.     Geradine Girtontacted pt 5/13 @ 10:30:Per Dr. Lolly MustacheArfeen, may fill RX for Wellbutrin through Mail Order. Informed patient 90 day RX will be sent to [facility]

## 2013-10-06 ENCOUNTER — Ambulatory Visit (HOSPITAL_COMMUNITY): Payer: Self-pay | Admitting: Psychiatry

## 2013-10-20 ENCOUNTER — Ambulatory Visit (INDEPENDENT_AMBULATORY_CARE_PROVIDER_SITE_OTHER): Payer: Private Health Insurance - Indemnity | Admitting: Psychiatry

## 2013-10-20 ENCOUNTER — Encounter (HOSPITAL_COMMUNITY): Payer: Self-pay | Admitting: Psychiatry

## 2013-10-20 VITALS — BP 113/73 | HR 103 | Wt 113.0 lb

## 2013-10-20 DIAGNOSIS — F988 Other specified behavioral and emotional disorders with onset usually occurring in childhood and adolescence: Secondary | ICD-10-CM

## 2013-10-20 MED ORDER — METHYLPHENIDATE HCL 10 MG PO TABS: 10.0000 mg | ORAL_TABLET | Freq: Every day | ORAL | Status: DC

## 2013-10-20 MED ORDER — BUPROPION HCL ER (XL) 150 MG PO TB24: ORAL_TABLET | ORAL | Status: DC

## 2013-10-20 NOTE — Progress Notes (Signed)
[facility] [id number] Progress Note  Jalene MulletDylan Hartsock [id number] 23 y.o.  10/20/2013 11:18 AM  Chief Complaint:  Medication management and followup.        History of Present Illness:  Don Sullivan came for his followup appointment.  He is compliant with his medication.  He is taking Ritalin 10 mg and Wellbutrin XL 150 mg daily.  He has noticed improvement in multitasking.  His energy level is good.  He continued to lose weight but he denies any issues with energy and he does not feel tired.  He is able to do his work on time.  He had a baby girl who is now 119 months old.  Patient had a good family support.  He is getting married with her fianc in September.  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 Patient denies any panic attacks or any nervousness.  He denies agitation anger or any mood swings.  He has no chest pain or any insomnia.  He is not using any drugs or any illegal substances.  He wants to continue his current psychotropic medication.  He denies any hallucination, paranoia, suicidal thoughts or homicidal thoughts.  He is taking antibiotic for acne.  Suicidal Ideation: No Plan Formed: No Patient has means to carry out plan: No  Homicidal Ideation: No Plan Formed: No Patient has means to carry out plan: No  Past Psychiatric History/Hospitalization(s) Patient endorses history of poor attention, focus anxiety and nervousness since middle school.  He denies any history of mania, psychosis, hallucinations, suicidal thoughts, homicidal thoughts or any poor impulse control.  He was [facility] by a [facility] and given Paxil, initially he was given low dose however when the dose was increased he started to experience increased agitation anger and increase in alcohol use.  Patient endorses history of social anxiety and nervousness. Anxiety: Yes Bipolar Disorder: No Depression: No Mania: No Psychosis: No Schizophrenia: No Personality Disorder: No Hospitalization for  psychiatric illness: No History of Electroconvulsive Shock Therapy: No Prior Suicide Attempts: No  Medical History; Patient has acne.  His primary care physician is [facility]John Griffin at [facility].  His dermatologist is Dr. Debby Freibergrew John .    Review of Systems: Psychiatric: Agitation: No Hallucination: No Depressed Mood: No Insomnia: No Hypersomnia: No Altered Concentration: No Feels Worthless: No Grandiose Ideas: No Belief In Special Powers: No New/Increased Substance Abuse: No Compulsions: No  Neurologic: Headache: No Seizure: No Paresthesias: No    Outpatient Encounter Prescriptions as of 10/20/2013  Medication Sig  . buPROPion (WELLBUTRIN XL) 150 MG 24 hr tablet TAKE ONE TABLET BY MOUTH EVERY MORNING  . cephALEXin (KEFLEX) 500 MG capsule   . methylphenidate (RITALIN) 10 MG tablet Take 1 tablet (10 mg total) by mouth daily.  Marland Kitchen. sulfamethoxazole-trimethoprim (BACTRIM DS) 800-160 MG per tablet   . [DISCONTINUED] buPROPion (WELLBUTRIN XL) 150 MG 24 hr tablet TAKE ONE TABLET BY MOUTH EVERY MORNING  . [DISCONTINUED] methylphenidate (RITALIN) 10 MG tablet Take 1 tablet (10 mg total) by mouth daily.    No results found for this or any previous visit (from the past 2160 hour(s)).    Physical Exam: Constitutional:  BP 113/73  Pulse 103  Wt 113 lb (51.256 kg)  Musculoskeletal: Strength & Muscle Tone: within normal limits Gait & Station: normal Patient leans: N/A  Mental Status Examination;  Patient is a young man who is casually dressed and fairly groomed.  He is cooperative.  He maintains fair eye contact.  His speech  is slow, fluent and coherent.  His thought processes logical and goal-directed.  He described his mood is neutral and his affect is mood appropriate.  He denies any active or passive suicidal thoughts or homicidal thoughts.  He denies any auditory or visual hallucination.  His attention and concentration is ok.  There were no paranoia or delusions present  at this time.  There were no tremors or shakes.  Psychomotor activity is slightly decreased.  His fund of knowledge is adequate.  He is alert and oriented x3.  His insight judgment and impulse control is okay.   Established Problem, Stable/Improving (1), Review of Last Therapy Session (1) and Review of Medication Regimen & Side Effects (2)  Assessment: Axis I:  social anxiety disorder, ADD   Axis II:  deferred  Axis III:  Past Medical History  Diagnosis Date  . Acne     Axis IV:  mild to moderate   Plan:  Patient is doing much better on his current psychotropic medication.  Discussed weight loss but patient does not have any other associated symptoms .  His energy level is good.  He mentioned that his weight has been between 115-113 in recent years.  Encouraged to keep diet and weight monitor on a regular basis.  I will continue his current psychotropic medications which are Wellbutrin XL 150 mg daily and Ritalin 10 mg daily.  I also discuss the abuse of stimulants, withdrawal and dependency.  Recommended to call us back if he has any question or any concern.  I will see him again in 3 months.   Chana Lindstrom T., MD 10/20/2013

## 2013-11-20 ENCOUNTER — Telehealth (HOSPITAL_COMMUNITY): Payer: Self-pay | Admitting: *Deleted

## 2013-11-20 ENCOUNTER — Other Ambulatory Visit (HOSPITAL_COMMUNITY): Payer: Self-pay | Admitting: *Deleted

## 2013-11-20 ENCOUNTER — Telehealth (HOSPITAL_COMMUNITY): Payer: Self-pay

## 2013-11-20 ENCOUNTER — Other Ambulatory Visit (HOSPITAL_COMMUNITY): Payer: Self-pay | Admitting: Psychiatry

## 2013-11-20 DIAGNOSIS — F988 Other specified behavioral and emotional disorders with onset usually occurring in childhood and adolescence: Secondary | ICD-10-CM

## 2013-11-20 MED ORDER — METHYLPHENIDATE HCL 10 MG PO TABS: 10.0000 mg | ORAL_TABLET | Freq: Every day | ORAL | Status: DC

## 2013-11-21 ENCOUNTER — Telehealth (HOSPITAL_COMMUNITY): Payer: Self-pay

## 2013-11-21 NOTE — Telephone Encounter (Signed)
11/21/13 11:05am Patient's girlfriend [facility](Brittney Clover MealyHannah Nicole Gordon  ZO#[id number]) PICK-UP RX SCRIPT./SH

## 2013-12-01 ENCOUNTER — Encounter (HOSPITAL_COMMUNITY): Payer: Self-pay | Admitting: Psychology

## 2013-12-01 DIAGNOSIS — F4010 Social phobia, unspecified: Secondary | ICD-10-CM

## 2013-12-01 NOTE — Progress Notes (Signed)
Don Sullivan is a 23 y.o. male patient discharged from counseling as last attended on 05/05/13.  Outpatient Therapist Discharge Summary  Jalene MulletDylan Sullivan    Sep 05, 1990   Admission Date: 03/17/13   Discharge Date:  12/01/13 Reason for Discharge:  Not active in counseling Diagnosis:    Social anxiety disorder    Comments:  Pt cancelled f/u on 06/02/13 and didn't reschedule for any future counseling.  Pt will continue w/ Dr. Lolly MustacheArfeen.  Malena PeerLeanne Kalven Ganim           [name], LPC

## 2013-12-20 ENCOUNTER — Other Ambulatory Visit (HOSPITAL_COMMUNITY): Payer: Self-pay | Admitting: *Deleted

## 2013-12-20 ENCOUNTER — Telehealth (HOSPITAL_COMMUNITY): Payer: Self-pay

## 2013-12-20 DIAGNOSIS — F988 Other specified behavioral and emotional disorders with onset usually occurring in childhood and adolescence: Secondary | ICD-10-CM

## 2013-12-20 MED ORDER — METHYLPHENIDATE HCL 10 MG PO TABS: 10.0000 mg | ORAL_TABLET | Freq: Every day | ORAL | Status: DC

## 2013-12-22 ENCOUNTER — Telehealth (HOSPITAL_COMMUNITY): Payer: Self-pay

## 2013-12-22 NOTE — Telephone Encounter (Signed)
12/22/13 4:47PM Patient came and pick-up rx script RU#[id number]

## 2014-01-22 ENCOUNTER — Telehealth (HOSPITAL_COMMUNITY): Payer: Self-pay

## 2014-01-22 ENCOUNTER — Other Ambulatory Visit (HOSPITAL_COMMUNITY): Payer: Self-pay | Admitting: Psychiatry

## 2014-01-22 DIAGNOSIS — F988 Other specified behavioral and emotional disorders with onset usually occurring in childhood and adolescence: Secondary | ICD-10-CM

## 2014-01-22 MED ORDER — METHYLPHENIDATE HCL 10 MG PO TABS: 10.0000 mg | ORAL_TABLET | Freq: Every day | ORAL | Status: DC

## 2014-01-23 ENCOUNTER — Telehealth (HOSPITAL_COMMUNITY): Payer: Self-pay

## 2014-01-23 NOTE — Telephone Encounter (Signed)
Harvel QualeBrittney Custer, spouse picked up prescription on 01/23/14  DL [id number]  dlo

## 2014-01-26 ENCOUNTER — Encounter (HOSPITAL_COMMUNITY): Payer: Self-pay | Admitting: Psychiatry

## 2014-01-26 ENCOUNTER — Ambulatory Visit (INDEPENDENT_AMBULATORY_CARE_PROVIDER_SITE_OTHER): Payer: Private Health Insurance - Indemnity | Admitting: Psychiatry

## 2014-01-26 VITALS — BP 109/71 | HR 96 | Wt 114.0 lb

## 2014-01-26 DIAGNOSIS — F909 Attention-deficit hyperactivity disorder, unspecified type: Secondary | ICD-10-CM

## 2014-01-26 DIAGNOSIS — F411 Generalized anxiety disorder: Secondary | ICD-10-CM

## 2014-01-26 DIAGNOSIS — F988 Other specified behavioral and emotional disorders with onset usually occurring in childhood and adolescence: Secondary | ICD-10-CM

## 2014-01-26 MED ORDER — METHYLPHENIDATE HCL 10 MG PO TABS: 10.0000 mg | ORAL_TABLET | Freq: Every day | ORAL | Status: DC

## 2014-01-26 MED ORDER — BUPROPION HCL ER (XL) 150 MG PO TB24: ORAL_TABLET | ORAL | Status: DC

## 2014-01-26 NOTE — Progress Notes (Signed)
[facility] [id number] Progress Note  Jalene MulletDylan Vallo [id number] 23 y.o.  01/26/2014 12:27 PM  Chief Complaint:  Medication management and followup.        History of Present Illness:  Don Sullivan came for his followup appointment.  He is very happy because he got married in September.  He is feeling much better on his medication.  He is relieved that his work situation is getting better.  Denies any irritability, anger or any mood swings.  He is sleeping good.  His appetite is okay.  His vitals are stable.  He denies any agitation or any anger.  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  He has a good family support.  He has 2 children.  Patient denies any drinking or using any illegal substances.   Suicidal Ideation: No Plan Formed: No Patient has means to carry out plan: No  Homicidal Ideation: No Plan Formed: No Patient has means to carry out plan: No  Past Psychiatric History/Hospitalization(s) Patient endorses history of poor attention, focus anxiety and nervousness since middle school.  He denies any history of mania, psychosis, hallucinations, suicidal thoughts, homicidal thoughts or any poor impulse control.  He was seen by a psychiatrist at [facility] and given Paxil, initially he was given low dose however when the dose was increased he started to experience increased agitation anger and increase in alcohol use.  Patient endorses history of social anxiety and nervousness. Anxiety: Yes Bipolar Disorder: No Depression: No Mania: No Psychosis: No Schizophrenia: No Personality Disorder: No Hospitalization for psychiatric illness: No History of Electroconvulsive Shock Therapy: No Prior Suicide Attempts: No  Medical History; Patient has acne.  His primary care physician is Dr. Kirby FunkJohn Griffin at [facility].  His dermatologist is Dr. Debby Freibergrew John .    Review of Systems: Psychiatric: Agitation: No Hallucination: No Depressed Mood: No Insomnia: No Hypersomnia: No Altered  Concentration: No Feels Worthless: No Grandiose Ideas: No Belief In Special Powers: No New/Increased Substance Abuse: No Compulsions: No  Neurologic: Headache: No Seizure: No Paresthesias: No    Outpatient Encounter Prescriptions as of 01/26/2014  Medication Sig  . buPROPion (WELLBUTRIN XL) 150 MG 24 hr tablet TAKE ONE TABLET BY MOUTH EVERY MORNING  . cephALEXin (KEFLEX) 500 MG capsule   . methylphenidate (RITALIN) 10 MG tablet Take 1 tablet (10 mg total) by mouth daily.  Marland Kitchen. sulfamethoxazole-trimethoprim (BACTRIM DS) 800-160 MG per tablet   . [DISCONTINUED] buPROPion (WELLBUTRIN XL) 150 MG 24 hr tablet TAKE ONE TABLET BY MOUTH EVERY MORNING  . [DISCONTINUED] methylphenidate (RITALIN) 10 MG tablet Take 1 tablet (10 mg total) by mouth daily.    No results found for this or any previous visit (from the past 2160 hour(s)).    Physical Exam: Constitutional:  BP 109/71  Pulse 96  Wt 114 lb (51.71 kg)  Musculoskeletal: Strength & Muscle Tone: within normal limits Gait & Station: normal Patient leans: N/A  Mental Status Examination;  Patient is a young man who is casually dressed and fairly groomed.  He is cooperative.  He maintains good eye contact.  His speech is slow, fluent and coherent.  His thought processes logical and goal-directed.  He described his mood is neutral and his affect is mood appropriate.  He denies any active or passive suicidal thoughts or homicidal thoughts.  He denies any auditory or visual hallucination.  His attention and concentration is ok.  There were no paranoia or delusions present at this time.  There were no tremors or shakes.  Psychomotor activity is slightly decreased.  His fund of knowledge is adequate.  He is alert and oriented x3.  His insight judgment and impulse control is okay.   Established Problem, Stable/Improving (1), Review of Last Therapy Session (1) and Review of Medication Regimen & Side Effects (2)  Assessment: Axis I:   Generalized anxiety disorder, ADD   Axis II:  deferred  Axis III:  Past Medical History  Diagnosis Date  . Acne     Axis IV:  mild to moderate   Plan:  Patient is doing much better on his current psychotropic medication.  His weight has been stable.  He does not have any chest pain, irritability, tremors or insomnia.  He does not ask for early refills on Ritalin. I will continue his current psychotropic medications which are Wellbutrin XL 150 mg daily and Ritalin 10 mg daily.  I also discuss the abuse of stimulants, withdrawal and dependency.  Recommended to call us back if he has any question or any concern.  I will see him again in 3 months.   Asaph Serena T., MD 01/26/2014

## 2014-01-27 ENCOUNTER — Other Ambulatory Visit (HOSPITAL_COMMUNITY): Payer: Self-pay | Admitting: Psychiatry

## 2014-01-29 ENCOUNTER — Other Ambulatory Visit (HOSPITAL_COMMUNITY): Payer: Self-pay | Admitting: Psychiatry

## 2014-01-29 NOTE — Telephone Encounter (Signed)
Given on 01/26/14 for 90 days

## 2014-03-15 NOTE — Telephone Encounter (Signed)
error 

## 2014-03-28 ENCOUNTER — Other Ambulatory Visit (HOSPITAL_COMMUNITY): Payer: Self-pay | Admitting: *Deleted

## 2014-03-28 DIAGNOSIS — F988 Other specified behavioral and emotional disorders with onset usually occurring in childhood and adolescence: Secondary | ICD-10-CM

## 2014-03-28 MED ORDER — METHYLPHENIDATE HCL 10 MG PO TABS: 10.0000 mg | ORAL_TABLET | Freq: Every day | ORAL | Status: DC

## 2014-03-30 ENCOUNTER — Telehealth (HOSPITAL_COMMUNITY): Payer: Self-pay

## 2014-03-30 NOTE — Telephone Encounter (Signed)
03/30/14 12:32pm Patient OH#[id number] came and pick-up rx script.Marland Kitchen.Marguerite Olea/sh

## 2014-05-01 ENCOUNTER — Telehealth (HOSPITAL_COMMUNITY): Payer: Self-pay

## 2014-05-01 NOTE — Telephone Encounter (Signed)
Telephone call with patient to follow up on message he left stating need for medication refills.  Patient stated he had forgotten when his appointment was scheduled but now states has enough of all medications until appointment scheduled 05/04/14.

## 2014-05-03 ENCOUNTER — Telehealth (HOSPITAL_COMMUNITY): Payer: Self-pay

## 2014-05-03 ENCOUNTER — Other Ambulatory Visit (HOSPITAL_COMMUNITY): Payer: Self-pay | Admitting: Psychiatry

## 2014-05-03 ENCOUNTER — Other Ambulatory Visit (HOSPITAL_COMMUNITY): Payer: Self-pay

## 2014-05-03 DIAGNOSIS — F988 Other specified behavioral and emotional disorders with onset usually occurring in childhood and adolescence: Secondary | ICD-10-CM

## 2014-05-03 MED ORDER — METHYLPHENIDATE HCL 10 MG PO TABS: 10.0000 mg | ORAL_TABLET | Freq: Every day | ORAL | Status: DC

## 2014-05-03 NOTE — Telephone Encounter (Signed)
Patient called requesting a refill of his Ritalin due to outpatient offices now being closed on 05/04/14 for inclement weather.  Patient reported he is rescheduled for 05/11/14 with Dr. Lolly MustacheArfeen and appointment verified.  Patient reported he had stopped taking Wellbutrin medication due to a returned "sensitivity" to his teeth.  Patient denied any depression, suicidal ideations or other return of symptoms since stopping Wellbutrin.  Patient will discuss this with Dr. Lolly MustacheArfeen at upcoming evaluation.  Patient to pick up refill order today or at the latest on Monday 05/07/14 as thinks has enough until then.

## 2014-05-03 NOTE — Telephone Encounter (Signed)
Reprint of patient's Ritalin as was discarded inappropriately today.  Authorized by Dr. Lolly MustacheArfeen to reprint for the third time today so patient could pick up this evening.

## 2014-05-03 NOTE — Telephone Encounter (Signed)
05/03/14  Patient's wife DL Harvel QualeBRITTNEY Kilman DL [id number] came and pick-up rx script.Marland Kitchen.Marguerite Olea/sh

## 2014-05-04 ENCOUNTER — Ambulatory Visit (HOSPITAL_COMMUNITY): Payer: Self-pay | Admitting: Psychiatry

## 2014-05-11 ENCOUNTER — Ambulatory Visit (INDEPENDENT_AMBULATORY_CARE_PROVIDER_SITE_OTHER): Payer: Private Health Insurance - Indemnity | Admitting: Psychiatry

## 2014-05-11 ENCOUNTER — Encounter (HOSPITAL_COMMUNITY): Payer: Self-pay | Admitting: Psychiatry

## 2014-05-11 VITALS — BP 119/74 | HR 100 | Ht 68.0 in | Wt 118.4 lb

## 2014-05-11 DIAGNOSIS — F988 Other specified behavioral and emotional disorders with onset usually occurring in childhood and adolescence: Secondary | ICD-10-CM

## 2014-05-11 DIAGNOSIS — F909 Attention-deficit hyperactivity disorder, unspecified type: Secondary | ICD-10-CM

## 2014-05-11 DIAGNOSIS — F411 Generalized anxiety disorder: Secondary | ICD-10-CM

## 2014-05-11 MED ORDER — METHYLPHENIDATE HCL 10 MG PO TABS: 10.0000 mg | ORAL_TABLET | Freq: Every day | ORAL | Status: DC

## 2014-05-11 NOTE — Progress Notes (Signed)
[facility] [phone] Progress Note  Don Sullivan [id number] 24 y.o.  05/11/2014 8:53 AM  Chief Complaint:  I stop taking Wellbutrin.  I thought it was causing the sensitivity.          History of Present Illness:  Don Sullivan came for his followup appointment.  He stopped taking Wellbutrin few weeks ago because he thought it is causing teeth sensitivity.  He reported for past few months he's been complaining of increase in sensitivity in his teeth and he discuss with the pharmacist and dentist who suggested to stop the Wellbutrin.  Even though he is not taking Wellbutrin he continues to have teeth sensitivity however he has not noticed any worsening of his anxiety or depression.  He mentioned he does not see any change either taking Wellbutrin or without any Wellbutrin.  He mentioned his anxiety is stable and he does not feel that he need another medication at this time.  His attention, focus and multitasking is good.  He is very happy because his sleep is improved and he is more alert during the day.  His job is going very well.  His wife is very supportive.  Patient denies any agitation, anger, mood swing or any side effects of the Ritalin.  He has no tremors or shakes.  His appetite is okay.  His vitals are stable.  Patient is taking antibiotic for his acne.  Patient denies drinking alcohol or using any illegal substances.  He had a good family support.  He has 2 children.  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    Suicidal Ideation: No Plan Formed: No Patient has means to carry out plan: No  Homicidal Ideation: No Plan Formed: No Patient has means to carry out plan: No  Past Psychiatric History/Hospitalization(s) Patient endorses history of poor attention, focus anxiety and nervousness since middle school.  He denies any history of mania, psychosis, hallucinations, suicidal thoughts, homicidal thoughts or any poor impulse control.  He was seen by a psychiatrist at [facility] and given  Paxil, initially he was given low dose however when the dose was increased he started to experience increased agitation anger and increase in alcohol use.  Patient endorses history of social anxiety and nervousness. Anxiety: Yes Bipolar Disorder: No Depression: No Mania: No Psychosis: No Schizophrenia: No Personality Disorder: No Hospitalization for psychiatric illness: No History of Electroconvulsive Shock Therapy: No Prior Suicide Attempts: No  Medical History; Patient has acne.  His primary care physician is Dr. Kirby Funk at [facility].  His dermatologist is Dr. Debby Freiberg .    Review of Systems  Constitutional: Negative for malaise/fatigue.  HENT:       Teeth sensitivity  Skin:       Acne rashes  Neurological: Negative for weakness.  Psychiatric/Behavioral: Negative for depression, suicidal ideas, hallucinations, memory loss and substance abuse. The patient is not nervous/anxious and does not have insomnia.     Psychiatric: Agitation: No Hallucination: No Depressed Mood: No Insomnia: No Hypersomnia: No Altered Concentration: No Feels Worthless: No Grandiose Ideas: No Belief In Special Powers: No New/Increased Substance Abuse: No Compulsions: No  Neurologic: Headache: No Seizure: No Paresthesias: No    Outpatient Encounter Prescriptions as of 05/11/2014  Medication Sig  . cephALEXin (KEFLEX) 500 MG capsule   . methylphenidate (RITALIN) 10 MG tablet Take 1 tablet (10 mg total) by mouth daily.  Marland Kitchen sulfamethoxazole-trimethoprim (BACTRIM DS) 800-160 MG per tablet   . [DISCONTINUED] methylphenidate (RITALIN) 10 MG tablet Take 1  tablet (10 mg total) by mouth daily.  . [DISCONTINUED] methylphenidate (RITALIN) 10 MG tablet Take 1 tablet (10 mg total) by mouth daily.  . [DISCONTINUED] buPROPion (WELLBUTRIN XL) 150 MG 24 hr tablet TAKE ONE TABLET BY MOUTH EVERY MORNING    No results found for this or any previous visit (from the past 2160  hour(s)).    Physical Exam: Constitutional:  BP 119/74 mmHg  Pulse 100  Ht 5\' 8"  (1.727 m)  Wt 118 lb 6.4 oz (53.706 kg)  BMI 18.01 kg/m2  Musculoskeletal: Strength & Muscle Tone: within normal limits Gait & Station: normal Patient leans: N/A  Mental Status Examination;  Patient is a young man who is casually dressed and fairly groomed.  He is pleasant and cooperative.  He maintains good eye contact.  His speech is slow, fluent and coherent.  His thought processes logical and goal-directed.  He described his mood is neutral and his affect is mood appropriate.  He denies any active or passive suicidal thoughts or homicidal thoughts.  He denies any auditory or visual hallucination.  His attention and concentration is ok.  There were no paranoia or delusions present at this time.  There were no tremors or shakes.  Psychomotor activity is slightly decreased.  His fund of knowledge is adequate.  He is alert and oriented x3.  His insight judgment and impulse control is okay.   Established Problem, Stable/Improving (1), Review of Psycho-Social Stressors (1), Review and summation of old records (2), Review of Last Therapy Session (1), Review of Medication Regimen & Side Effects (2) and Review of New Medication or Change in Dosage (2)  Assessment: Axis I:  Generalized anxiety disorder, ADD   Axis II:  deferred  Axis III:  Past Medical History  Diagnosis Date  . Acne     Plan:  I will discontinue Wellbutrin since patient is no longer taking it.  Despite stopping Wellbutrin his teeth sensitivity is still there .  I suggested to speak with the dentist to address this issue.  At this time patient is not interested to start any antidepressant because he feels his anxiety and depression is under control.  He is doing very well on Ritalin.  He does not have any side effects.  I will continue Ritalin 10 mg daily.  Discussed medication side effects and benefits.  We will consider blood work on his  next appointment.  I suggested to call us back if he feels his anxiety and depression is getting worse.  Follow-up in 3 months. Time spent 25 minutes.  More than 50% of the time spent in psychoeducation, counseling and coordination of care.  Discuss safety plan that anytime having active suicidal thoughts or homicidal thoughts then patient need to call 911 or go to the local emergency room.   Hezzie Karim T., MD 05/11/2014

## 2014-08-03 ENCOUNTER — Telehealth (HOSPITAL_COMMUNITY): Payer: Self-pay

## 2014-08-03 DIAGNOSIS — F988 Other specified behavioral and emotional disorders with onset usually occurring in childhood and adolescence: Secondary | ICD-10-CM

## 2014-08-03 MED ORDER — METHYLPHENIDATE HCL 10 MG PO TABS: 10.0000 mg | ORAL_TABLET | Freq: Every day | ORAL | Status: DC

## 2014-08-03 NOTE — Telephone Encounter (Signed)
Arrie picked up prescription on 08/03/14  Kemp [id number]/dlo

## 2014-08-03 NOTE — Telephone Encounter (Signed)
Telephone call from patient today requesting a refill of his prescribed Ritalin.  Patient to see Dr. Lolly MustacheArfeen on 08/10/14 and last written prescription for Ritalin filled on 07/02/14.  Dr. Lolly MustacheArfeen authorized refill and patient agreed to pick up order this date.

## 2014-08-10 ENCOUNTER — Ambulatory Visit (INDEPENDENT_AMBULATORY_CARE_PROVIDER_SITE_OTHER): Payer: Private Health Insurance - Indemnity | Admitting: Psychiatry

## 2014-08-10 ENCOUNTER — Encounter (HOSPITAL_COMMUNITY): Payer: Self-pay | Admitting: Psychiatry

## 2014-08-10 VITALS — BP 109/76 | HR 102 | Ht 67.25 in | Wt 119.6 lb

## 2014-08-10 DIAGNOSIS — F909 Attention-deficit hyperactivity disorder, unspecified type: Secondary | ICD-10-CM

## 2014-08-10 DIAGNOSIS — Z79899 Other long term (current) drug therapy: Secondary | ICD-10-CM

## 2014-08-10 DIAGNOSIS — F988 Other specified behavioral and emotional disorders with onset usually occurring in childhood and adolescence: Secondary | ICD-10-CM

## 2014-08-10 DIAGNOSIS — F411 Generalized anxiety disorder: Secondary | ICD-10-CM

## 2014-08-10 MED ORDER — METHYLPHENIDATE HCL 10 MG PO TABS: 10.0000 mg | ORAL_TABLET | Freq: Every day | ORAL | Status: DC

## 2014-08-10 NOTE — Progress Notes (Signed)
[facility] [id number] Progress Note  Hayzen Lorenson [id number] 24 y.o.  08/10/2014 9:09 AM  Chief Complaint:  Medication management and follow-up.           History of Present Illness:  Don Sullivan came for his followup appointment.   He brought his 56-year-old daughter because his wife has appointment.  Patient doing very well on his current medication.  He is taking Ritalin and he is able to do multitasking.  He denies any anxiety or any insomnia.  His attention and focus is good.  He has no tremors or shakes.  He continues to have teeth sensitivity however it has been less intense and less frequent.  Patient is not interested to try any other medications since returning helping his multitasking.  He denies any crying spells, irritability, feeling of hopelessness or worthlessness.  His appetite is okay.  His vitals are stable.  Patient is still taking antibiotic for his acne.  She denies drinking or using any illegal substances.  He had a good family support.  He has 2 children.  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    Suicidal Ideation: No Plan Formed: No Patient has means to carry out plan: No  Homicidal Ideation: No Plan Formed: No Patient has means to carry out plan: No  Past Psychiatric History/Hospitalization(s) Patient endorses history of poor attention, focus issues,  anxiety since middle school.  He denies any history of mania, psychosis, hallucinations, suicidal thoughts, homicidal thoughts or any poor impulse control.  He was seen by a psychiatrist at [facility] and given Paxil,  but he felt more agitated and anger when the dose was increased. We had tried Wellbutrin but he has side effects.  Anxiety: Yes Bipolar Disorder: No Depression: No Mania: No Psychosis: No Schizophrenia: No Personality Disorder: No Hospitalization for psychiatric illness: No History of Electroconvulsive Shock Therapy: No Prior Suicide Attempts: No  Medical History; Patient has acne.  His  primary care physician is Dr. Kirby Funk at [facility].  His dermatologist is Dr. Debby Freiberg .    Review of Systems  Constitutional: Negative.   Cardiovascular: Negative for chest pain and palpitations.  Neurological: Negative for dizziness and tremors.  Psychiatric/Behavioral: Negative for suicidal ideas, hallucinations and substance abuse. The patient is not nervous/anxious and does not have insomnia.     Psychiatric: Agitation: No Hallucination: No Depressed Mood: No Insomnia: No Hypersomnia: No Altered Concentration: No Feels Worthless: No Grandiose Ideas: No Belief In Special Powers: No New/Increased Substance Abuse: No Compulsions: No  Neurologic: Headache: No Seizure: No Paresthesias: No    Outpatient Encounter Prescriptions as of 08/10/2014  Medication Sig  . cephALEXin (KEFLEX) 500 MG capsule   . methylphenidate (RITALIN) 10 MG tablet Take 1 tablet (10 mg total) by mouth daily.  Marland Kitchen sulfamethoxazole-trimethoprim (BACTRIM DS) 800-160 MG per tablet   . [DISCONTINUED] methylphenidate (RITALIN) 10 MG tablet Take 1 tablet (10 mg total) by mouth daily.  . [DISCONTINUED] methylphenidate (RITALIN) 10 MG tablet Take 1 tablet (10 mg total) by mouth daily.  . [DISCONTINUED] methylphenidate (RITALIN) 10 MG tablet Take 1 tablet (10 mg total) by mouth daily.    No results found for this or any previous visit (from the past 2160 hour(s)).    Physical Exam: Constitutional:  BP 109/76 mmHg  Pulse 102  Ht 5' 7.25" (1.708 m)  Wt 119 lb 9.6 oz (54.25 kg)  BMI 18.60 kg/m2  SpO2 98%  Musculoskeletal: Strength & Muscle Tone: within normal limits Gait &  Station: normal Patient leans: N/A  Mental Status Examination;  Patient is a young man who is casually dressed and fairly groomed.  He is pleasant and cooperative.  He maintains good eye contact.  His speech is slow, fluent and coherent.  His thought processes logical and goal-directed.  He described his mood is neutral  and his affect is mood appropriate.  He denies any active or passive suicidal thoughts or homicidal thoughts.  He denies any auditory or visual hallucination.  His attention and concentration is ok.  There were no paranoia or delusions present at this time.  There were no tremors or shakes.  Psychomotor activity is slightly decreased.  His fund of knowledge is adequate.  He is alert and oriented x3.  His insight judgment and impulse control is okay.   Established Problem, Stable/Improving (1), Review of Psycho-Social Stressors (1), Review or order clinical lab tests (1), Review of Last Therapy Session (1) and Review of Medication Regimen & Side Effects (2)  Assessment: Axis I:  Generalized anxiety disorder, ADD   Axis II:  deferred  Axis III:  Past Medical History  Diagnosis Date  . Acne     Plan:  Patient is doing better on his current medication.  Continue Ritalin 10 mg daily.  He has no side effects.  I will order CBC, CMP, hemoglobin A1c, TSH and lipid panel.  Recommended to call us back if he has any question or any concern.  Follow-up in 3 months.   ARFEEN,SYED T., MD 08/10/2014

## 2014-08-25 LAB — COMPLETE METABOLIC PANEL WITH GFR
ALT: 25 U/L (ref 0–53)
AST: 24 U/L (ref 0–37)
Albumin: 4.6 g/dL (ref 3.5–5.2)
Alkaline Phosphatase: 64 U/L (ref 39–117)
BILIRUBIN TOTAL: 0.6 mg/dL (ref 0.2–1.2)
BUN: 14 mg/dL (ref 6–23)
CHLORIDE: 101 meq/L (ref 96–112)
CO2: 27 mEq/L (ref 19–32)
Calcium: 9.1 mg/dL (ref 8.4–10.5)
Creat: 1.22 mg/dL (ref 0.50–1.35)
GFR, Est African American: >89 mL/min
GFR, Est Non African American: 83 mL/min
Glucose, Bld: 84 mg/dL (ref 70–99)
Potassium: 4.1 mEq/L (ref 3.5–5.3)
SODIUM: 138 meq/L (ref 135–145)
Total Protein: 6.9 g/dL (ref 6.0–8.3)

## 2014-08-25 LAB — LIPID PANEL
CHOL/HDL RATIO: 3.2 ratio
CHOLESTEROL: 139 mg/dL (ref 0–200)
HDL: 44 mg/dL (ref >=40)
LDL Cholesterol: 78 mg/dL (ref 0–99)
Triglycerides: 83 mg/dL (ref <150)
VLDL: 17 mg/dL (ref 0–40)

## 2014-08-25 LAB — CBC WITH DIFFERENTIAL/PLATELET
Basophils Absolute: 0 10*3/uL (ref 0.0–0.1)
Basophils Relative: 1 % (ref 0–1)
EOS PCT: 2 % (ref 0–5)
Eosinophils Absolute: 0.1 10*3/uL (ref 0.0–0.7)
HCT: 44.2 % (ref 39.0–52.0)
HEMOGLOBIN: 15.3 g/dL (ref 13.0–17.0)
LYMPHS ABS: 1.4 10*3/uL (ref 0.7–4.0)
Lymphocytes Relative: 35 % (ref 12–46)
MCH: 30.2 pg (ref 26.0–34.0)
MCHC: 34.6 g/dL (ref 30.0–36.0)
MCV: 87.2 fL (ref 78.0–100.0)
MONO ABS: 0.6 10*3/uL (ref 0.1–1.0)
MPV: 9 fL (ref 8.6–12.4)
Monocytes Relative: 15 % — ABNORMAL HIGH (ref 3–12)
NEUTROS ABS: 1.9 10*3/uL (ref 1.7–7.7)
Neutrophils Relative %: 47 % (ref 43–77)
Platelets: 238 10*3/uL (ref 150–400)
RBC: 5.07 MIL/uL (ref 4.22–5.81)
RDW: 13 % (ref 11.5–15.5)
WBC: 4 10*3/uL (ref 4.0–10.5)

## 2014-08-25 LAB — HEMOGLOBIN A1C
HEMOGLOBIN A1C: 5.4 % (ref <5.7)
MEAN PLASMA GLUCOSE: 108 mg/dL (ref <117)

## 2014-08-25 LAB — TSH: TSH: 1.608 u[IU]/mL (ref 0.350–4.500)

## 2014-11-09 ENCOUNTER — Encounter (HOSPITAL_COMMUNITY): Payer: Self-pay | Admitting: Psychiatry

## 2014-11-09 ENCOUNTER — Ambulatory Visit (INDEPENDENT_AMBULATORY_CARE_PROVIDER_SITE_OTHER): Payer: Private Health Insurance - Indemnity | Admitting: Psychiatry

## 2014-11-09 VITALS — BP 103/63 | HR 84 | Ht 68.0 in | Wt 117.0 lb

## 2014-11-09 DIAGNOSIS — F411 Generalized anxiety disorder: Secondary | ICD-10-CM

## 2014-11-09 DIAGNOSIS — F988 Other specified behavioral and emotional disorders with onset usually occurring in childhood and adolescence: Secondary | ICD-10-CM

## 2014-11-09 DIAGNOSIS — F909 Attention-deficit hyperactivity disorder, unspecified type: Secondary | ICD-10-CM | POA: Diagnosis not present

## 2014-11-09 NOTE — Progress Notes (Signed)
Don Sullivan [phone] Progress Note  Don Sullivan [id number] 24 y.o.  11/09/2014 9:10 AM  Chief Complaint:  I stop taking Ritalin.  It was making me sweat I'm doing much better.  I don't think I need medication at this time.             History of Present Illness:  Don Sullivan came for his followup appointment.  He stopped taking his medication since May 31 because he was planning to go [facility] and he was complaining of sweating and he wanted to try if Ritalin causing the sewat.  Patient told since then he is doing much better.  His mind is more clear and he is able to do multitasking.  He was a relief because job was not busy in the beginning and he was concerned that he may have difficulty doing multitasking.  However lately even though he is very busy at work he is able to do multitasking without the medication.  He sleeping better.  He denies any irritability, tremors or shakes.  He denies any major panic attack.  He denies any feeling of hopelessness and worthlessness.  He is taking multiple and about it for his acne.  He was also given 1 time Valium for wisdom tooth extraction.  Patient denies drinking or using any illegal substances.  He denies any crying spells or any suicidal thoughts.  His appetite is okay.  His vitals are stable. He had a good family support.  He has 2 children.  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  Patient has blood work in May 2016 and he has normal CBC, chemistry, metabolic panel TSH and hemoglobin A1c.  Suicidal Ideation: No Plan Formed: No Patient has means to carry out plan: No  Homicidal Ideation: No Plan Formed: No Patient has means to carry out plan: No  Past Psychiatric History/Hospitalization(s) Patient endorses history of poor attention, focus issues,  anxiety since middle school.  He denies any history of mania, psychosis, hallucinations, suicidal thoughts, homicidal thoughts or any poor impulse control.  He was seen by a psychiatrist at [facility]  and given Paxil,  but he felt more agitated and anger when the dose was increased. We had tried Wellbutrin but he has side effects.  Anxiety: Yes Bipolar Disorder: No Depression: No Mania: No Psychosis: No Schizophrenia: No Personality Disorder: No Hospitalization for psychiatric illness: No History of Electroconvulsive Shock Therapy: No Prior Suicide Attempts: No  Medical History; Patient has acne.  His primary care physician is Don Sullivan at [place].  His dermatologist is Don Sullivan .    Review of Systems  Constitutional: Negative.   Cardiovascular: Negative for chest pain and palpitations.  Neurological: Negative for dizziness and tremors.  Psychiatric/Behavioral: Negative for suicidal ideas, hallucinations and substance abuse. The patient is not nervous/anxious and does not have insomnia.     Psychiatric: Agitation: No Hallucination: No Depressed Mood: No Insomnia: No Hypersomnia: No Altered Concentration: No Feels Worthless: No Grandiose Ideas: No Belief In Special Powers: No New/Increased Substance Abuse: No Compulsions: No  Neurologic: Headache: No Seizure: No Paresthesias: No    Outpatient Encounter Prescriptions as of 11/09/2014  Medication Sig  . cephALEXin (KEFLEX) 500 MG capsule   . sulfamethoxazole-trimethoprim (BACTRIM DS) 800-160 MG per tablet   . methylphenidate (RITALIN) 10 MG tablet Take 1 tablet (10 mg total) by mouth daily. (Patient not taking: Reported on 11/09/2014)   No facility-administered encounter medications on file as of 11/09/2014.    Recent  Results (from the past 2160 hour(s))  CBC with Differential/Platelet     Status: Abnormal   Collection Time: 08/25/14 11:50 AM  Result Value Ref Range   WBC 4.0 4.0 - 10.5 K/uL   RBC 5.07 4.22 - 5.81 MIL/uL   Hemoglobin 15.3 13.0 - 17.0 g/dL   HCT 44.2 39.0 - 52.0 %   MCV 87.2 78.0 - 100.0 fL   MCH 30.2 26.0 - 34.0 pg   MCHC 34.6 30.0 - 36.0 g/dL   RDW 13.0 11.5 - 15.5 %    Platelets 238 150 - 400 K/uL   MPV 9.0 8.6 - 12.4 fL   Neutrophils Relative % 47 43 - 77 %   Neutro Abs 1.9 1.7 - 7.7 K/uL   Lymphocytes Relative 35 12 - 46 %   Lymphs Abs 1.4 0.7 - 4.0 K/uL   Monocytes Relative 15 (H) 3 - 12 %   Monocytes Absolute 0.6 0.1 - 1.0 K/uL   Eosinophils Relative 2 0 - 5 %   Eosinophils Absolute 0.1 0.0 - 0.7 K/uL   Basophils Relative 1 0 - 1 %   Basophils Absolute 0.0 0.0 - 0.1 K/uL   Smear Review Criteria for review not met   COMPLETE METABOLIC PANEL WITH GFR     Status: None   Collection Time: 08/25/14 11:50 AM  Result Value Ref Range   Sodium 138 135 - 145 mEq/L   Potassium 4.1 3.5 - 5.3 mEq/L   Chloride 101 96 - 112 mEq/L   CO2 27 19 - 32 mEq/L   Glucose, Bld 84 70 - 99 mg/dL   BUN 14 6 - 23 mg/dL   Creat 1.22 0.50 - 1.35 mg/dL   Total Bilirubin 0.6 0.2 - 1.2 mg/dL   Alkaline Phosphatase 64 39 - 117 U/L   AST 24 0 - 37 U/L   ALT 25 0 - 53 U/L   Total Protein 6.9 6.0 - 8.3 g/dL   Albumin 4.6 3.5 - 5.2 g/dL   Calcium 9.1 8.4 - 10.5 mg/dL   GFR, Est African American >89 mL/min   GFR, Est Non African American 83 mL/min    Comment:   The estimated GFR is a calculation valid for adults (>=84 years old) that uses the CKD-EPI algorithm to adjust for age and sex. It is   not to be used for children, pregnant women, hospitalized patients,    patients on dialysis, or with rapidly changing kidney function. According to the NKDEP, eGFR >89 is normal, 60-89 shows mild impairment, 30-59 shows moderate impairment, 15-29 shows severe impairment and <15 is ESRD.     Hemoglobin A1c     Status: None   Collection Time: 08/25/14 11:50 AM  Result Value Ref Range   Hgb A1c MFr Bld 5.4 <5.7 %    Comment:                                                                        According to the ADA Clinical Practice Recommendations for 2011, when HbA1c is used as a screening test:     >=6.5%   Diagnostic of Diabetes Mellitus            (if abnormal result is  confirmed)  5.7-6.4%   Increased risk of developing Diabetes Mellitus   References:Diagnosis and Classification of Diabetes Mellitus,Diabetes NTIR,4431,54(MGQQP 1):S62-S69 and Standards of Medical Care in         Diabetes - 2011,Diabetes YPPJ,0932,67 (Suppl 1):S11-S61.      Mean Plasma Glucose 108 <117 mg/dL  TSH     Status: None   Collection Time: 08/25/14 11:50 AM  Result Value Ref Range   TSH 1.608 0.350 - 4.500 uIU/mL  Lipid panel     Status: None   Collection Time: 08/25/14 11:50 AM  Result Value Ref Range   Cholesterol 139 0 - 200 mg/dL    Comment: ATP III Classification:       < 200        mg/dL        Desirable      200 - 239     mg/dL        Borderline High      >= 240        mg/dL        High      Triglycerides 83 <150 mg/dL   HDL 44 >=40 mg/dL    Comment: ** Please note change in reference range(s). **   Total CHOL/HDL Ratio 3.2 Ratio   VLDL 17 0 - 40 mg/dL   LDL Cholesterol 78 0 - 99 mg/dL    Comment:   Total Cholesterol/HDL Ratio:CHD Risk                        Coronary Heart Disease Risk Table                                        Men       Women          1/2 Average Risk              3.4        3.3              Average Risk              5.0        4.4           2X Average Risk              9.6        7.1           3X Average Risk             23.4       11.0 Use the calculated Patient Ratio above and the CHD Risk table  to determine the patient's CHD Risk. ATP III Classification (LDL):       < 100        mg/dL         Optimal      100 - 129     mg/dL         Near or Above Optimal      130 - 159     mg/dL         Borderline High      160 - 189     mg/dL         High       > 190        mg/dL         Very  High         Physical Exam: Constitutional:  BP 103/63 mmHg  Pulse 84  Ht _0  (1.727 m)  Wt 117 lb (53.071 kg)  BMI 17.79 kg/m2  Musculoskeletal: Strength & Muscle Tone: within normal limits Gait & Station: normal Patient leans:  N/A  Mental Status Examination;  Patient is a young man who is casually dressed and fairly groomed.  He is pleasant and cooperative.  He maintains good eye contact.  His speech is slow, fluent and coherent.  His thought processes logical and goal-directed.  He described his mood is neutral and his affect is mood appropriate.  He denies any active or passive suicidal thoughts or homicidal thoughts.  He denies any auditory or visual hallucination.  His attention and concentration is ok.  There were no paranoia or delusions present at this time.  There were no tremors or shakes.  Psychomotor activity is slightly decreased.  His fund of knowledge is adequate.  He is alert and oriented x3.  His insight judgment and impulse control is okay.   Established Problem, Stable/Improving (1), Review of Psycho-Social Stressors (1), Review or order clinical lab tests (1), Review of Last Therapy Session (1) and Review of Medication Regimen & Side Effects (2)  Assessment: Axis I:  Generalized anxiety disorder, ADD   Axis II:  deferred  Axis III:  Past Medical History  Diagnosis Date  . Acne     Plan:  Patient is no longer taking the Ritalin because of side effects and he does not want to go back because he is actually doing better without the medication.  I review his blood work results with him.  He has normal CBC, CMP and hemoglobin A1c.  He denies any major panic attack since the last visit.  We will observe him without medication however I strongly encouraged to call us back if he started to feel that symptoms are coming back.  Patient promised that he will call us if he has any question concern .  I will see him again in 3 months.  No prescription given on this visit.   Ruffus Kamaka T., MD 11/09/2014

## 2015-02-08 ENCOUNTER — Ambulatory Visit (HOSPITAL_COMMUNITY): Payer: Self-pay | Admitting: Psychiatry

## 2017-10-06 ENCOUNTER — Ambulatory Visit: Payer: Self-pay | Admitting: Family Medicine

## 2017-10-06 ENCOUNTER — Encounter: Payer: Self-pay | Admitting: Family Medicine

## 2017-10-06 VITALS — BP 110/70 | HR 98 | Temp 98.9°F | Wt 123.8 lb

## 2017-10-06 DIAGNOSIS — J029 Acute pharyngitis, unspecified: Secondary | ICD-10-CM

## 2017-10-06 DIAGNOSIS — H6123 Impacted cerumen, bilateral: Secondary | ICD-10-CM

## 2017-10-06 DIAGNOSIS — Z0000 Encounter for general adult medical examination without abnormal findings: Secondary | ICD-10-CM

## 2017-10-06 LAB — POCT RAPID STREP A (OFFICE): Rapid Strep A Screen: NEGATIVE

## 2017-10-06 NOTE — Progress Notes (Signed)
Don Sullivan is a 27 y.o. male who presents today with concerns of sore throat for 1 day he reports consulting google and feels that he met the criteria for strep throat risk.  Review of Systems  Constitutional: Negative for chills, fever and malaise/fatigue.  HENT: Positive for sore throat. Negative for congestion, ear discharge, ear pain and sinus pain.   Eyes: Negative.   Respiratory: Negative for cough, sputum production and shortness of breath.   Cardiovascular: Negative.  Negative for chest pain.  Gastrointestinal: Negative for abdominal pain, diarrhea, nausea and vomiting.  Genitourinary: Negative for dysuria, frequency, hematuria and urgency.  Musculoskeletal: Negative for myalgias.  Skin: Negative.   Neurological: Negative for headaches.  Endo/Heme/Allergies: Negative.   Psychiatric/Behavioral: Negative.     O: Vitals:   10/06/17 1845  BP: 110/70  Pulse: 98  Temp: 98.9 F (37.2 C)  SpO2: 99%     Physical Exam  Constitutional: He is oriented to person, place, and time. Vital signs are normal. He appears well-developed and well-nourished. He is active.  Non-toxic appearance. He does not have a sickly appearance.  HENT:  Head: Normocephalic.  Right Ear: Hearing, tympanic membrane, external ear and ear canal normal.  Left Ear: Hearing, tympanic membrane, external ear and ear canal normal.  Nose: Nose normal.  Mouth/Throat: Uvula is midline, oropharynx is clear and moist and mucous membranes are normal. Tonsils are 0 on the right. Tonsils are 0 on the left. No tonsillar exudate.  Cerumen impaction bilaterally  Ears flushed   Neck: Normal range of motion. Neck supple.  Cardiovascular: Normal rate, regular rhythm, normal heart sounds and normal pulses.  Pulmonary/Chest: Effort normal and breath sounds normal.  Abdominal: Soft. Bowel sounds are normal.  Musculoskeletal: Normal range of motion.  Lymphadenopathy:       Head (right side): No submental and no submandibular  adenopathy present.       Head (left side): No submental and no submandibular adenopathy present.    He has no cervical adenopathy.  Neurological: He is alert and oriented to person, place, and time.  Psychiatric: He has a normal mood and affect.  Vitals reviewed.  A:  1. Sore throat   2. Bilateral impacted cerumen    P: Exam findings, diagnosis etiology and medication use and indications reviewed with patient. Follow- Up and discharge instructions provided. No emergent/urgent issues found on exam.  Patient verbalized understanding of information provided and agrees with plan of care (POC), all questions answered.\  1. Sore throat - POCT rapid strep A Results for orders placed or performed in visit on 10/06/17 (from the past 24 hour(s))  POCT rapid strep A     Status: None   Collection Time: 10/06/17  7:21 PM  Result Value Ref Range   Rapid Strep A Screen Negative Negative   Supportive care symptoms encouraged- f/u if symptoms progress or become worse  2. Bilateral impacted cerumen Ears flushed

## 2017-10-06 NOTE — Patient Instructions (Addendum)
PLAN: Warm Salt water rinses Tylenol -OR- Motrin for pain Cepacol throat lozenges over the counter  Sore Throat When you have a sore throat, your throat may:  Hurt.  Burn.  Feel irritated.  Feel scratchy.  Many things can cause a sore throat, including:  An infection.  Allergies.  Dryness in the air.  Smoke or pollution.  Gastroesophageal reflux disease (GERD).  A tumor.  A sore throat can be the first sign of another sickness. It can happen with other problems, like coughing or a fever. Most sore throats go away without treatment. Follow these instructions at home:  Take over-the-counter medicines only as told by your doctor.  Drink enough fluids to keep your pee (urine) clear or pale yellow.  Rest when you feel you need to.  To help with pain, try: ? Sipping warm liquids, such as broth, herbal tea, or warm water. ? Eating or drinking cold or frozen liquids, such as frozen ice pops. ? Gargling with a salt-water mixture 3-4 times a day or as needed. To make a salt-water mixture, add -1 tsp of salt in 1 cup of warm water. Mix it until you cannot see the salt anymore. ? Sucking on hard candy or throat lozenges. ? Putting a cool-mist humidifier in your bedroom at night. ? Sitting in the bathroom with the door closed for 5-10 minutes while you run hot water in the shower.  Do not use any tobacco products, such as cigarettes, chewing tobacco, and e-cigarettes. If you need help quitting, ask your doctor. Contact a doctor if:  You have a fever for more than 2-3 days.  You keep having symptoms for more than 2-3 days.  Your throat does not get better in 7 days.  You have a fever and your symptoms suddenly get worse. Get help right away if:  You have trouble breathing.  You cannot swallow fluids, soft foods, or your saliva.  You have swelling in your throat or neck that gets worse.  You keep feeling like you are going to throw up (vomit).  You keep throwing  up. This information is not intended to replace advice given to you by your health care provider. Make sure you discuss any questions you have with your health care provider. Document Released: 01/07/2008 Document Revised: 11/24/2015 Document Reviewed: 01/18/2015 Elsevier Interactive Patient Education  [employer]2018 Elsevier Inc.

## 2018-05-15 ENCOUNTER — Telehealth: Payer: Self-pay | Admitting: Family

## 2018-05-15 DIAGNOSIS — R059 Cough, unspecified: Secondary | ICD-10-CM

## 2018-05-15 DIAGNOSIS — R079 Chest pain, unspecified: Secondary | ICD-10-CM

## 2018-05-15 DIAGNOSIS — R05 Cough: Secondary | ICD-10-CM

## 2018-05-15 NOTE — Progress Notes (Signed)
Based on what you shared with me it looks like you have a serious condition that should be evaluated in a face to face office visit.  NOTE: If you entered your credit card information for this eVisit, you will not be charged. You may see a "hold" on your card for the $30 but that hold will drop off and you will not have a charge processed.  Give your current symptoms of chest pain, I believe it would be best to be evaluated face to face.   If you are having a true medical emergency please call 911.  If you need an urgent face to face visit, [place] has four urgent care centers for your convenience.  If you need care fast and have a high deductible or no insurance consider:   [email or web address] to reserve your spot online an avoid wait times  [facility] [address], [address] [place], [place] [address] 8 am to 8 pm Monday-Friday 10 am to 4 pm Saturday-Sunday *Across the street from [employer]  [address] [place], [address] 8 am to 5 pm Monday-Friday * In the [facility] on the [facility]   The following sites will take your  insurance:  . [facility]  [phone] Get Driving Directions Find a Provider at this Location  [address] [place], [place] [address] . 10 am to 8 pm Monday-Friday . 12 pm to 8 pm Saturday-Sunday   . [facility] at [facility]  [phone] Get Driving Directions Find a Provider at this Location  [address] [place], [place] [address] . 8 am to 8 pm Monday-Friday . 9 am to 6 pm Saturday . 11 am to 6 pm Sunday   . [facility] at [facility]  [phone] Get Driving Directions  [address] [place], [place] [address] . 8 am to 8 pm Monday-Friday . 8 am to 4 pm Saturday-Sunday   Your e-visit answers were reviewed by a board certified advanced clinical practitioner to complete your  personal care plan.  Thank you for using e-Visits.

## 2020-10-08 ENCOUNTER — Other Ambulatory Visit (HOSPITAL_COMMUNITY): Payer: Self-pay

## 2020-10-08 ENCOUNTER — Other Ambulatory Visit (HOSPITAL_BASED_OUTPATIENT_CLINIC_OR_DEPARTMENT_OTHER): Payer: Self-pay

## 2020-10-08 MED ORDER — POLYMYXIN B-TRIMETHOPRIM 10000-0.1 UNIT/ML-% OP SOLN
1.0000 [drp] | Freq: Four times a day (QID) | OPHTHALMIC | 0 refills | Status: DC
  Filled 2020-10-08 (×2): qty 10, 25d supply, fill #0

## 2021-05-07 ENCOUNTER — Other Ambulatory Visit: Payer: Self-pay

## 2021-07-10 ENCOUNTER — Other Ambulatory Visit (HOSPITAL_COMMUNITY): Payer: Self-pay

## 2021-07-10 ENCOUNTER — Ambulatory Visit (INDEPENDENT_AMBULATORY_CARE_PROVIDER_SITE_OTHER): Payer: No Typology Code available for payment source | Admitting: Family Medicine

## 2021-07-10 DIAGNOSIS — G8929 Other chronic pain: Secondary | ICD-10-CM

## 2021-07-10 DIAGNOSIS — M546 Pain in thoracic spine: Secondary | ICD-10-CM | POA: Diagnosis not present

## 2021-07-10 DIAGNOSIS — K219 Gastro-esophageal reflux disease without esophagitis: Secondary | ICD-10-CM

## 2021-07-10 DIAGNOSIS — Z0000 Encounter for general adult medical examination without abnormal findings: Secondary | ICD-10-CM

## 2021-07-10 MED ORDER — OMEPRAZOLE 20 MG PO CPDR
20.0000 mg | DELAYED_RELEASE_CAPSULE | Freq: Every day | ORAL | 2 refills | Status: DC
  Filled 2021-07-10: qty 30, 30d supply, fill #0

## 2021-07-10 NOTE — Patient Instructions (Signed)

## 2021-07-10 NOTE — Assessment & Plan Note (Signed)
No red flags on history or exam, generally doing well at present ?Discussed expected management, can return to clinic for this if symptoms do recur.  Can evaluate further at that time to better assess potential etiology.  Based on history, suspect possible paraspinal muscle spasm ?

## 2021-07-10 NOTE — Assessment & Plan Note (Signed)
Patient has what sounds to be slightly worsening reflux symptoms.  At this time, can treat with standard dose PPI, instructed on proper dosing.  Also discussed lifestyle modifications which can be utilized.  Monitor for symptom response over the next 8 months, if not improving as expected, consider increasing intensity of therapy or possible GI evaluation ?

## 2021-07-10 NOTE — Progress Notes (Signed)
? ?New Patient Office Visit ? ?Subjective:  ?Patient ID: Don Sullivan, male    DOB: 03-26-1991  Age: 31 y.o. MRN: [id number] ? ?CC: Establish care, back pain, reflux ? ?HPI ?Don Sullivan is a 31 yo male presenting to establish in clinic.  He has current concerns as outlined above.  Reports past medical history of ADD. ? ?Back pain: Reports that 2 to 3 years ago he injured himself jumping into his pool where he hit the back of his head and hurt his neck/mid back.  Symptoms mostly resolved over the following 2 to 3 months, but he has had occasional episodes of mid back pain sporadically.  Feels that pain will flareup about once per year.  Has not had any radiation of symptoms, no numbness or tingling.  Not currently in pain at present.  No specific aggravating or alleviating factors. ? ?Reflux: Feels that he has had some issues recently where he will be needing to clear his throat more often, has increased mucus in his throat.  Denies any significant allergies, postnasal drip.  Does feel as though he will have some reflux, more noticeable when he lays down after eating.  No significant heartburn or chest pain reported. ? ?He also reports some issues with what he feels are tonsil stones.  Wonders if it might be related to any dietary changes he has had.  No specific pain or bad breath associated with this. ? ?Patient is originally from [place].  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  Outside of work he enjoys riding his bike, walking, hiking, spending time with family, they recently got a new puppy. ? ?Past Medical History:  ?Diagnosis Date  ? Acne   ? ? ?No past surgical history on file. ? ?Family History  ?Problem Relation Age of Onset  ? Depression Mother   ? ? ?Social History  ? ?Socioeconomic History  ? Marital status: Married  ?  Spouse name: Not on file  ? Number of children: Not on file  ? Years of education: Not on file  ? Highest  education level: Not on file  ?Occupational History  ? Not on file  ?Tobacco Use  ? Smoking status: Never  ? Smokeless tobacco: Never  ?Substance and Sexual Activity  ? Alcohol use: No  ?  Alcohol/week: 0.0 standard drinks  ? Drug use: No  ? Sexual activity: Yes  ?Other Topics Concern  ? Not on file  ?Social History Narrative  ? Not on file  ? ?Social Determinants of Health  ? ?Financial Resource Strain: Not on file  ?Food Insecurity: Not on file  ?Transportation Needs: Not on file  ?Physical Activity: Not on file  ?Stress: Not on file  ?Social Connections: Not on file  ?Intimate Partner Violence: Not on file  ? ? ?Objective:  ? ?Today's Vitals: BP 108/83   Pulse 89   Ht 5\' 8"  (1.727 m)   Wt 128 lb 12.8 oz (58.4 kg)   SpO2 98%   BMI 19.58 kg/m?  ? ?Physical Exam ? ?31 year old male in no acute distress ?Cardiovascular exam with regular rate and rhythm, no murmur appreciated ?Lungs clear to auscultation bilaterally ?Cervical and thoracic spine without any tenderness to palpation over spinous processes, no tenderness to palpation through paraspinal muscles.  Normal cervical range of motion, no pain elicited ? ?Assessment & Plan:  ? ?Problem List Items Addressed This Visit   ? ?  ?  Digestive  ? Acid reflux  ?  Patient has what sounds to be slightly worsening reflux symptoms.  At this time, can treat with standard dose PPI, instructed on proper dosing.  Also discussed lifestyle modifications which can be utilized.  Monitor for symptom response over the next 8 months, if not improving as expected, consider increasing intensity of therapy or possible GI evaluation ?  ?  ? Relevant Medications  ? omeprazole (PRILOSEC) 20 MG capsule  ?  ? Other  ? Thoracic back pain  ?  No red flags on history or exam, generally doing well at present ?Discussed expected management, can return to clinic for this if symptoms do recur.  Can evaluate further at that time to better assess potential etiology.  Based on history, suspect  possible paraspinal muscle spasm ?  ?  ? ?Other Visit Diagnoses   ? ? Wellness examination      ? Relevant Orders  ? CBC with Differential/Platelet  ? Comprehensive metabolic panel  ? TSH Rfx on Abnormal to Free T4  ? Lipid panel  ? Hemoglobin A1c  ? ?  ? ? ?Outpatient Encounter Medications as of 07/10/2021  ?Medication Sig  ? omeprazole (PRILOSEC) 20 MG capsule Take 1 capsule  by mouth daily.  ? [DISCONTINUED] cephALEXin (KEFLEX) 500 MG capsule   ? [DISCONTINUED] methylphenidate (RITALIN) 10 MG tablet Take 1 tablet (10 mg total) by mouth daily. (Patient not taking: Reported on 11/09/2014)  ? [DISCONTINUED] sulfamethoxazole-trimethoprim (BACTRIM DS) 800-160 MG per tablet   ? [DISCONTINUED] trimethoprim-polymyxin b (POLYTRIM) ophthalmic solution Instill 1 drop into affected eye(s) every 6 hours until directed to stop for 5-7 days.  ? ?No facility-administered encounter medications on file as of 07/10/2021.  ? ? ?Follow-up: Return in about 2 months (around 09/09/2021) for CPE.  Plan for follow-up in about 2 months for CPE, nurse visit for labs 1 week prior ? ?Janie Capp J [place], MD ? ?

## 2021-08-14 ENCOUNTER — Ambulatory Visit (HOSPITAL_BASED_OUTPATIENT_CLINIC_OR_DEPARTMENT_OTHER): Payer: No Typology Code available for payment source

## 2021-08-14 ENCOUNTER — Other Ambulatory Visit (HOSPITAL_BASED_OUTPATIENT_CLINIC_OR_DEPARTMENT_OTHER): Payer: Self-pay | Admitting: Family Medicine

## 2021-08-14 DIAGNOSIS — Z0000 Encounter for general adult medical examination without abnormal findings: Secondary | ICD-10-CM

## 2021-08-15 LAB — LIPID PANEL
Chol/HDL Ratio: 4.4 ratio (ref 0.0–5.0)
Cholesterol, Total: 177 mg/dL (ref 100–199)
HDL: 40 mg/dL (ref >39)
LDL Chol Calc (NIH): 118 mg/dL — ABNORMAL HIGH (ref 0–99)
Triglycerides: 105 mg/dL (ref 0–149)
VLDL Cholesterol Cal: 19 mg/dL (ref 5–40)

## 2021-08-15 LAB — COMPREHENSIVE METABOLIC PANEL
ALT: 14 IU/L (ref 0–44)
AST: 19 IU/L (ref 0–40)
Albumin/Globulin Ratio: 2.4 — ABNORMAL HIGH (ref 1.2–2.2)
Albumin: 4.8 g/dL (ref 4.1–5.2)
Alkaline Phosphatase: 69 IU/L (ref 44–121)
BUN/Creatinine Ratio: 16 (ref 9–20)
BUN: 14 mg/dL (ref 6–20)
Bilirubin Total: 0.4 mg/dL (ref 0.0–1.2)
CO2: 25 mmol/L (ref 20–29)
Calcium: 9.5 mg/dL (ref 8.7–10.2)
Chloride: 103 mmol/L (ref 96–106)
Creatinine, Ser: 0.88 mg/dL (ref 0.76–1.27)
Globulin, Total: 2 g/dL (ref 1.5–4.5)
Glucose: 89 mg/dL (ref 70–99)
Potassium: 3.9 mmol/L (ref 3.5–5.2)
Sodium: 142 mmol/L (ref 134–144)
Total Protein: 6.8 g/dL (ref 6.0–8.5)
eGFR: 119 mL/min/{1.73_m2} (ref >59)

## 2021-08-15 LAB — CBC WITH DIFFERENTIAL/PLATELET
Basophils Absolute: 0 10*3/uL (ref 0.0–0.2)
Basos: 1 %
EOS (ABSOLUTE): 0.2 10*3/uL (ref 0.0–0.4)
Eos: 5 %
Hematocrit: 44.4 % (ref 37.5–51.0)
Hemoglobin: 15.3 g/dL (ref 13.0–17.7)
Immature Grans (Abs): 0 10*3/uL (ref 0.0–0.1)
Immature Granulocytes: 0 %
Lymphocytes Absolute: 1.5 10*3/uL (ref 0.7–3.1)
Lymphs: 34 %
MCH: 30.1 pg (ref 26.6–33.0)
MCHC: 34.5 g/dL (ref 31.5–35.7)
MCV: 87 fL (ref 79–97)
Monocytes Absolute: 0.4 10*3/uL (ref 0.1–0.9)
Monocytes: 9 %
Neutrophils Absolute: 2.3 10*3/uL (ref 1.4–7.0)
Neutrophils: 51 %
Platelets: 224 10*3/uL (ref 150–450)
RBC: 5.08 x10E6/uL (ref 4.14–5.80)
RDW: 12.5 % (ref 11.6–15.4)
WBC: 4.4 10*3/uL (ref 3.4–10.8)

## 2021-08-15 LAB — HEMOGLOBIN A1C
Est. average glucose Bld gHb Est-mCnc: 105 mg/dL
Hgb A1c MFr Bld: 5.3 % (ref 4.8–5.6)

## 2021-08-15 LAB — TSH RFX ON ABNORMAL TO FREE T4: TSH: 1.07 u[IU]/mL (ref 0.450–4.500)

## 2021-08-22 ENCOUNTER — Encounter (HOSPITAL_BASED_OUTPATIENT_CLINIC_OR_DEPARTMENT_OTHER): Payer: Self-pay | Admitting: Family Medicine

## 2021-08-22 ENCOUNTER — Ambulatory Visit (INDEPENDENT_AMBULATORY_CARE_PROVIDER_SITE_OTHER): Payer: No Typology Code available for payment source | Admitting: Family Medicine

## 2021-08-22 DIAGNOSIS — Z0000 Encounter for general adult medical examination without abnormal findings: Secondary | ICD-10-CM | POA: Insufficient documentation

## 2021-08-22 DIAGNOSIS — Z23 Encounter for immunization: Secondary | ICD-10-CM | POA: Diagnosis not present

## 2021-08-22 DIAGNOSIS — J358 Other chronic diseases of tonsils and adenoids: Secondary | ICD-10-CM

## 2021-08-22 NOTE — Assessment & Plan Note (Signed)
Routine HCM labs reviewed. HCM reviewed/discussed. Anticipatory guidance regarding healthy weight, lifestyle and choices given. Recommend healthy diet.  Recommend approximately 150 minutes/week of moderate intensity exercise Recommend regular dental and vision exams Always use seatbelt/lap and shoulder restraints Recommend using smoke alarms and checking batteries at least twice a year Recommend using sunscreen when outside Discussed tetanus immunization recommendations, patient agreed to proceed with this today 

## 2021-08-22 NOTE — Progress Notes (Signed)
?Subjective:   ? ?CC: Annual Physical Exam ? ?HPI:  ?Don Sullivan is a 31 y.o. presenting for annual physical ? ?I reviewed the past medical history, family history, social history, surgical history, and allergies today and no changes were needed.  Please see the problem list section below in epic for further details. ? ?Past Medical History: ?Past Medical History:  ?Diagnosis Date  ? Acne   ? ?Past Surgical History: ?History reviewed. No pertinent surgical history. ?Social History: ?Social History  ? ?Socioeconomic History  ? Marital status: Married  ?  Spouse name: Not on file  ? Number of children: Not on file  ? Years of education: Not on file  ? Highest education level: Not on file  ?Occupational History  ? Not on file  ?Tobacco Use  ? Smoking status: Never  ? Smokeless tobacco: Never  ?Substance and Sexual Activity  ? Alcohol use: No  ?  Alcohol/week: 0.0 standard drinks  ? Drug use: No  ? Sexual activity: Yes  ?Other Topics Concern  ? Not on file  ?Social History Narrative  ? Not on file  ? ?Social Determinants of Health  ? ?Financial Resource Strain: Not on file  ?Food Insecurity: Not on file  ?Transportation Needs: Not on file  ?Physical Activity: Not on file  ?Stress: Not on file  ?Social Connections: Not on file  ? ?Family History: ?Family History  ?Problem Relation Age of Onset  ? Depression Mother   ? ?Allergies: ?Allergies  ?Allergen Reactions  ? Solodyn [Minocycline Hcl] Hives  ? ?Medications: See med rec. ? ?Review of Systems: No headache, visual changes, nausea, vomiting, diarrhea, constipation, dizziness, abdominal pain, skin rash, fevers, chills, night sweats, swollen lymph nodes, weight loss, chest pain, body aches, joint swelling, muscle aches, shortness of breath, mood changes, visual or auditory hallucinations. ? ?Objective:   ? ?BP 106/80   Pulse 83   Temp 97.6 ?F (36.4 ?C) (Oral)   Ht 5' 7.72" (1.72 m)   Wt 127 lb 9.6 oz (57.9 kg)   SpO2 100%   BMI 19.56 kg/m?  ? ?General: Well  Developed, well nourished, and in no acute distress.  ?Neuro: Alert and oriented x3, extra-ocular muscles intact, sensation grossly intact. Cranial nerves II through XII are intact, motor, sensory, and coordinative functions are all intact. ?HEENT: Normocephalic, atraumatic, pupils equal round reactive to light, neck supple, no masses, no lymphadenopathy, thyroid nonpalpable. Oropharynx, nasopharynx, external ear canals are unremarkable. ?Skin: Warm and dry, no rashes noted.  ?Cardiac: Regular rate and rhythm, no murmurs rubs or gallops.  ?Respiratory: Clear to auscultation bilaterally. Not using accessory muscles, speaking in full sentences.  ?Abdominal: Soft, nontender, nondistended, positive bowel sounds, no masses, no organomegaly.  ?Musculoskeletal: Shoulder, elbow, wrist, hip, knee, ankle stable, and with full range of motion. ? ?Impression and Recommendations:   ? ?Wellness examination ?Routine HCM labs reviewed. HCM reviewed/discussed. Anticipatory guidance regarding healthy weight, lifestyle and choices given. ?Recommend healthy diet.  Recommend approximately 150 minutes/week of moderate intensity exercise ?Recommend regular dental and vision exams ?Always use seatbelt/lap and shoulder restraints ?Recommend using smoke alarms and checking batteries at least twice a year ?Recommend using sunscreen when outside ?Discussed tetanus immunization recommendations, patient agreed to proceed with this today ? ?Tonsillolith ?Patient with history of tonsillitis.  Does have a white area over right side of tonsils that he reports has been there for at least 1 year.  Has not changed much in size, does not have any pain associated with the  area.  He has tried to manipulate the area on his own without any change in appearance.  On exam today, area of white discoloration is present over right aspect of pharynx/tonsil.  Area appears to have some thin mucous membrane overlying it, does not necessarily appear consistent with a  tonsillolith.  Given duration of lesion, will refer to ENT for further evaluation and recommendations ? ?Plan for follow-up in 1 year or sooner as needed ? ? ?___________________________________________ ?Yavuz Kirby de [place], MD, ABFM, CAQSM ?Primary Care and Sports Medicine ?[facility] ?

## 2021-08-22 NOTE — Patient Instructions (Signed)
?  Medication Instructions:  ?Your physician recommends that you continue on your current medications as directed. Please refer to the Current Medication list given to you today. ?--If you need a refill on any your medications before your next appointment, please call your pharmacy first. If no refills are authorized on file call the office.-- ?Lab Work: ?Your physician has recommended that you have lab work today: No ?If you have labs (blood work) drawn today and your tests are completely normal, you will receive your results via [place] a phone call from our staff.  ?Please ensure you check your voicemail in the event that you authorized detailed messages to be left on a delegated number. If you have any lab test that is abnormal or we need to change your treatment, we will call you to review the results. ? ?Referrals/Procedures/Imaging: ?ENT ? ?Follow-Up: ?Your next appointment:   ?Your physician recommends that you schedule a follow-up appointment in 1 year cpe with Dr. [place]. ? ?You will receive a text message or e-mail with a link to a survey about your care and experience with [place] today! We would greatly appreciate your feedback!  ? ?Thanks for letting us be apart of your health journey!!  ?Primary Care and Sports Medicine  ? ?Dr. Kyung Rudd [place]  ? ?We encourage you to activate your patient portal called "MyChart".  Sign up information is provided on this After Visit Summary.  MyChart is used to connect with patients for Virtual Visits (Telemedicine).  Patients are able to view lab/test results, encounter notes, upcoming appointments, etc.  Non-urgent messages can be sent to your provider as well. To learn more about what you can do with MyChart, please visit --  [email or web address].    ?

## 2021-08-22 NOTE — Assessment & Plan Note (Signed)
Patient with history of tonsillitis.  Does have a white area over right side of tonsils that he reports has been there for at least 1 year.  Has not changed much in size, does not have any pain associated with the area.  He has tried to manipulate the area on his own without any change in appearance.  On exam today, area of white discoloration is present over right aspect of pharynx/tonsil.  Area appears to have some thin mucous membrane overlying it, does not necessarily appear consistent with a tonsillolith.  Given duration of lesion, will refer to ENT for further evaluation and recommendations ?

## 2022-03-12 ENCOUNTER — Other Ambulatory Visit (HOSPITAL_COMMUNITY): Payer: Self-pay

## 2022-03-12 MED ORDER — SODIUM FLUORIDE 1.1 % DT CREA
TOPICAL_CREAM | DENTAL | 99 refills | Status: AC
  Filled 2022-03-12: qty 51, 30d supply, fill #0

## 2022-08-25 ENCOUNTER — Encounter (HOSPITAL_BASED_OUTPATIENT_CLINIC_OR_DEPARTMENT_OTHER): Payer: Self-pay | Admitting: Family Medicine

## 2022-08-25 ENCOUNTER — Ambulatory Visit (INDEPENDENT_AMBULATORY_CARE_PROVIDER_SITE_OTHER): Payer: 59 | Admitting: Family Medicine

## 2022-08-25 VITALS — BP 118/92 | HR 71 | Temp 97.7°F | Ht 67.72 in | Wt 131.7 lb

## 2022-08-25 DIAGNOSIS — Z0000 Encounter for general adult medical examination without abnormal findings: Secondary | ICD-10-CM | POA: Diagnosis not present

## 2022-08-25 NOTE — Assessment & Plan Note (Addendum)
Routine HCM labs reviewed. HCM reviewed/discussed. Anticipatory guidance regarding healthy weight, lifestyle and choices given. Recommend healthy diet.  Recommend approximately 150 minutes/week of moderate intensity exercise Recommend regular dental and vision exams Always use seatbelt/lap and shoulder restraints Recommend using smoke alarms and checking batteries at least twice a year Recommend using sunscreen when outside Immunizations discussed

## 2022-08-25 NOTE — Progress Notes (Signed)
  Subjective:    CC: Annual Physical Exam  HPI: Don Sullivan is a 32 y.o. presenting for annual physical  I reviewed the past medical history, family history, social history, surgical history, and allergies today and no changes were needed.  Please see the problem list section below in epic for further details.  Past Medical History: Past Medical History:  Diagnosis Date   Acne    Past Surgical History: No past surgical history on file. Social History: Social History   Socioeconomic History   Marital status: Married    Spouse name: Not on file   Number of children: Not on file   Years of education: Not on file   Highest education level: Not on file  Occupational History   Not on file  Tobacco Use   Smoking status: Never   Smokeless tobacco: Never  Substance and Sexual Activity   Alcohol use: No    Alcohol/week: 0.0 standard drinks of alcohol   Drug use: No   Sexual activity: Yes  Other Topics Concern   Not on file  Social History Narrative   Not on file   Social Determinants of Health   Financial Resource Strain: Not on file  Food Insecurity: Not on file  Transportation Needs: Not on file  Physical Activity: Not on file  Stress: Not on file  Social Connections: Not on file   Family History: Family History  Problem Relation Age of Onset   Depression Mother    Allergies: Allergies  Allergen Reactions   Solodyn [Minocycline Hcl] Hives   Medications: See med rec.  Review of Systems: No headache, visual changes, nausea, vomiting, diarrhea, constipation, dizziness, abdominal pain, skin rash, fevers, chills, night sweats, swollen lymph nodes, weight loss, chest pain, body aches, joint swelling, muscle aches, shortness of breath, mood changes, visual or auditory hallucinations.  Objective:    BP (!) 118/92 (BP Location: Left Arm, Patient Position: Sitting, Cuff Size: Normal)   Pulse 71   Temp 97.7 F (36.5 C) (Oral)   Ht 5' 7.72" (1.72 m)   Wt 131 lb 11.2  oz (59.7 kg)   SpO2 100%   BMI 20.19 kg/m   General: Well Developed, well nourished, and in no acute distress. Neuro: Alert and oriented x3, extra-ocular muscles intact, sensation grossly intact. Cranial nerves II through XII are intact, motor, sensory, and coordinative functions are all intact. HEENT: Normocephalic, atraumatic, pupils equal round reactive to light, neck supple, no masses, no lymphadenopathy, thyroid nonpalpable. Oropharynx, nasopharynx, external ear canals are unremarkable. Skin: Warm and dry, no rashes noted. Cardiac: Regular rate and rhythm, no murmurs rubs or gallops. Respiratory: Clear to auscultation bilaterally. Not using accessory muscles, speaking in full sentences. Abdominal: Soft, nontender, nondistended, positive bowel sounds, no masses, no organomegaly. Musculoskeletal: Shoulder, elbow, wrist, hip, knee, ankle stable, and with full range of motion.  Impression and Recommendations:    Wellness examination Routine HCM labs reviewed. HCM reviewed/discussed. Anticipatory guidance regarding healthy weight, lifestyle and choices given. Recommend healthy diet.  Recommend approximately 150 minutes/week of moderate intensity exercise Recommend regular dental and vision exams Always use seatbelt/lap and shoulder restraints Recommend using smoke alarms and checking batteries at least twice a year Recommend using sunscreen when outside Immunizations discussed  Return in about 1 year (around 08/25/2023) for CPE.   ___________________________________________ Don Aufiero de [place], MD, ABFM, CAQSM Primary Care and Sports Medicine [facility]

## 2022-08-26 LAB — COMPREHENSIVE METABOLIC PANEL
ALT: 14 IU/L (ref 0–44)
AST: 21 IU/L (ref 0–40)
Albumin/Globulin Ratio: 2.1 (ref 1.2–2.2)
Albumin: 4.7 g/dL (ref 4.1–5.1)
Alkaline Phosphatase: 77 IU/L (ref 44–121)
BUN/Creatinine Ratio: 20 (ref 9–20)
BUN: 16 mg/dL (ref 6–20)
Bilirubin Total: 0.3 mg/dL (ref 0.0–1.2)
CO2: 22 mmol/L (ref 20–29)
Calcium: 9.3 mg/dL (ref 8.7–10.2)
Chloride: 105 mmol/L (ref 96–106)
Creatinine, Ser: 0.8 mg/dL (ref 0.76–1.27)
Globulin, Total: 2.2 g/dL (ref 1.5–4.5)
Glucose: 87 mg/dL (ref 70–99)
Potassium: 4.5 mmol/L (ref 3.5–5.2)
Sodium: 140 mmol/L (ref 134–144)
Total Protein: 6.9 g/dL (ref 6.0–8.5)
eGFR: 121 mL/min/{1.73_m2} (ref >59)

## 2022-08-26 LAB — CBC WITH DIFFERENTIAL/PLATELET
Basophils Absolute: 0 10*3/uL (ref 0.0–0.2)
Basos: 1 %
EOS (ABSOLUTE): 0.3 10*3/uL (ref 0.0–0.4)
Eos: 8 %
Hematocrit: 45.4 % (ref 37.5–51.0)
Hemoglobin: 15 g/dL (ref 13.0–17.7)
Immature Grans (Abs): 0 10*3/uL (ref 0.0–0.1)
Immature Granulocytes: 0 %
Lymphocytes Absolute: 1.5 10*3/uL (ref 0.7–3.1)
Lymphs: 36 %
MCH: 29.6 pg (ref 26.6–33.0)
MCHC: 33 g/dL (ref 31.5–35.7)
MCV: 90 fL (ref 79–97)
Monocytes Absolute: 0.5 10*3/uL (ref 0.1–0.9)
Monocytes: 12 %
Neutrophils Absolute: 1.7 10*3/uL (ref 1.4–7.0)
Neutrophils: 43 %
Platelets: 237 10*3/uL (ref 150–450)
RBC: 5.07 x10E6/uL (ref 4.14–5.80)
RDW: 12.2 % (ref 11.6–15.4)
WBC: 4 10*3/uL (ref 3.4–10.8)

## 2022-08-26 LAB — LIPID PANEL
Chol/HDL Ratio: 4.7 ratio (ref 0.0–5.0)
Cholesterol, Total: 186 mg/dL (ref 100–199)
HDL: 40 mg/dL (ref >39)
LDL Chol Calc (NIH): 125 mg/dL — ABNORMAL HIGH (ref 0–99)
Triglycerides: 115 mg/dL (ref 0–149)
VLDL Cholesterol Cal: 21 mg/dL (ref 5–40)

## 2022-08-26 LAB — TSH RFX ON ABNORMAL TO FREE T4: TSH: 1.26 u[IU]/mL (ref 0.450–4.500)

## 2022-11-18 ENCOUNTER — Other Ambulatory Visit (HOSPITAL_COMMUNITY): Payer: Self-pay

## 2024-05-23 ENCOUNTER — Encounter (HOSPITAL_BASED_OUTPATIENT_CLINIC_OR_DEPARTMENT_OTHER): Admitting: Family Medicine
